# Patient Record
Sex: Male | Born: 1975 | Race: White | Hispanic: No | Smoking: Current some day smoker
Health system: Southern US, Community
[De-identification: ages and names within clinical notes are randomized; demographics above are authoritative.]

## PROBLEM LIST (undated history)

## (undated) DIAGNOSIS — G35D Multiple sclerosis, unspecified: Secondary | ICD-10-CM

## (undated) DIAGNOSIS — G35 Multiple sclerosis: Secondary | ICD-10-CM

## (undated) HISTORY — PX: CHOLECYSTECTOMY: SHX55

## (undated) HISTORY — PX: APPENDECTOMY: SHX54

## (undated) HISTORY — PX: OTHER SURGICAL HISTORY: SHX169

---

## 2004-08-13 ENCOUNTER — Emergency Department: Payer: Self-pay | Admitting: General Practice

## 2004-08-24 ENCOUNTER — Emergency Department: Payer: Self-pay | Admitting: Emergency Medicine

## 2005-07-31 ENCOUNTER — Emergency Department: Payer: Self-pay | Admitting: Emergency Medicine

## 2007-07-22 ENCOUNTER — Other Ambulatory Visit: Payer: Self-pay

## 2007-07-22 ENCOUNTER — Emergency Department: Payer: Self-pay | Admitting: Internal Medicine

## 2013-12-28 ENCOUNTER — Emergency Department: Payer: Self-pay | Admitting: Emergency Medicine

## 2013-12-28 LAB — COMPREHENSIVE METABOLIC PANEL
AST: 20 U/L (ref 15–37)
Albumin: 3.8 g/dL (ref 3.4–5.0)
Alkaline Phosphatase: 80 U/L
Anion Gap: 4 — ABNORMAL LOW (ref 7–16)
BUN: 16 mg/dL (ref 7–18)
Bilirubin,Total: 0.4 mg/dL (ref 0.2–1.0)
CHLORIDE: 109 mmol/L — AB (ref 98–107)
CO2: 29 mmol/L (ref 21–32)
CREATININE: 1.26 mg/dL (ref 0.60–1.30)
Calcium, Total: 8.8 mg/dL (ref 8.5–10.1)
EGFR (African American): >60
EGFR (Non-African Amer.): >60
Glucose: 86 mg/dL (ref 65–99)
OSMOLALITY: 284 (ref 275–301)
Potassium: 4.4 mmol/L (ref 3.5–5.1)
SGPT (ALT): 21 U/L
Sodium: 142 mmol/L (ref 136–145)
Total Protein: 7.8 g/dL (ref 6.4–8.2)

## 2013-12-28 LAB — URINALYSIS, COMPLETE
Bilirubin,UR: NEGATIVE
Glucose,UR: NEGATIVE mg/dL (ref 0–75)
Ketone: NEGATIVE
Leukocyte Esterase: NEGATIVE
Nitrite: NEGATIVE
PH: 5 (ref 4.5–8.0)
Protein: NEGATIVE
RBC,UR: 521 /HPF (ref 0–5)
SPECIFIC GRAVITY: 1.021 (ref 1.003–1.030)
Squamous Epithelial: <1

## 2013-12-28 LAB — LIPASE, BLOOD: Lipase: 192 U/L (ref 73–393)

## 2013-12-28 LAB — CBC
HCT: 50.7 % (ref 40.0–52.0)
HGB: 16.4 g/dL (ref 13.0–18.0)
MCH: 30.1 pg (ref 26.0–34.0)
MCHC: 32.4 g/dL (ref 32.0–36.0)
MCV: 93 fL (ref 80–100)
PLATELETS: 320 10*3/uL (ref 150–440)
RBC: 5.46 10*6/uL (ref 4.40–5.90)
RDW: 13.6 % (ref 11.5–14.5)
WBC: 11.3 10*3/uL — ABNORMAL HIGH (ref 3.8–10.6)

## 2014-01-17 ENCOUNTER — Emergency Department: Payer: Self-pay | Admitting: Emergency Medicine

## 2014-01-17 LAB — URINALYSIS, COMPLETE
Bilirubin,UR: NEGATIVE
Glucose,UR: NEGATIVE mg/dL (ref 0–75)
Ketone: NEGATIVE
LEUKOCYTE ESTERASE: NEGATIVE
Nitrite: NEGATIVE
Ph: 6 (ref 4.5–8.0)
Protein: NEGATIVE
Specific Gravity: 1.006 (ref 1.003–1.030)

## 2018-04-23 ENCOUNTER — Encounter: Payer: Self-pay | Admitting: Emergency Medicine

## 2018-04-23 ENCOUNTER — Inpatient Hospital Stay
Admission: EM | Admit: 2018-04-23 | Discharge: 2018-04-28 | DRG: 060 | Disposition: A | Discharge status: Home / Self Care | Payer: Medicare Other | Attending: Internal Medicine | Admitting: Internal Medicine

## 2018-04-23 ENCOUNTER — Other Ambulatory Visit: Payer: Self-pay

## 2018-04-23 ENCOUNTER — Inpatient Hospital Stay: Payer: Medicare Other

## 2018-04-23 DIAGNOSIS — F1210 Cannabis abuse, uncomplicated: Secondary | ICD-10-CM | POA: Diagnosis present

## 2018-04-23 DIAGNOSIS — Z880 Allergy status to penicillin: Secondary | ICD-10-CM | POA: Diagnosis not present

## 2018-04-23 DIAGNOSIS — T380X5A Adverse effect of glucocorticoids and synthetic analogues, initial encounter: Secondary | ICD-10-CM | POA: Diagnosis not present

## 2018-04-23 DIAGNOSIS — Z9049 Acquired absence of other specified parts of digestive tract: Secondary | ICD-10-CM | POA: Diagnosis not present

## 2018-04-23 DIAGNOSIS — F17200 Nicotine dependence, unspecified, uncomplicated: Secondary | ICD-10-CM | POA: Diagnosis not present

## 2018-04-23 DIAGNOSIS — R296 Repeated falls: Secondary | ICD-10-CM | POA: Diagnosis present

## 2018-04-23 DIAGNOSIS — G35 Multiple sclerosis: Secondary | ICD-10-CM | POA: Diagnosis present

## 2018-04-23 DIAGNOSIS — F329 Major depressive disorder, single episode, unspecified: Secondary | ICD-10-CM | POA: Diagnosis present

## 2018-04-23 DIAGNOSIS — D519 Vitamin B12 deficiency anemia, unspecified: Secondary | ICD-10-CM | POA: Diagnosis present

## 2018-04-23 DIAGNOSIS — F17210 Nicotine dependence, cigarettes, uncomplicated: Secondary | ICD-10-CM | POA: Diagnosis present

## 2018-04-23 DIAGNOSIS — K219 Gastro-esophageal reflux disease without esophagitis: Secondary | ICD-10-CM | POA: Diagnosis not present

## 2018-04-23 DIAGNOSIS — R259 Unspecified abnormal involuntary movements: Secondary | ICD-10-CM | POA: Diagnosis present

## 2018-04-23 HISTORY — DX: Multiple sclerosis, unspecified: G35.D

## 2018-04-23 HISTORY — DX: Multiple sclerosis: G35

## 2018-04-23 LAB — URINE DRUG SCREEN, QUALITATIVE (ARMC ONLY)
Amphetamines, Ur Screen: NOT DETECTED
Barbiturates, Ur Screen: NOT DETECTED
Benzodiazepine, Ur Scrn: NOT DETECTED
Cannabinoid 50 Ng, Ur ~~LOC~~: POSITIVE — AB
Cocaine Metabolite,Ur ~~LOC~~: NOT DETECTED
MDMA (Ecstasy)Ur Screen: NOT DETECTED
Methadone Scn, Ur: NOT DETECTED
Opiate, Ur Screen: NOT DETECTED
Phencyclidine (PCP) Ur S: NOT DETECTED
TRICYCLIC, UR SCREEN: NOT DETECTED

## 2018-04-23 LAB — URINALYSIS, COMPLETE (UACMP) WITH MICROSCOPIC
Bacteria, UA: NONE SEEN
Bilirubin Urine: NEGATIVE
Glucose, UA: NEGATIVE mg/dL
HGB URINE DIPSTICK: NEGATIVE
Ketones, ur: NEGATIVE mg/dL
Leukocytes,Ua: NEGATIVE
Nitrite: NEGATIVE
PROTEIN: NEGATIVE mg/dL
SPECIFIC GRAVITY, URINE: 1.01 (ref 1.005–1.030)
pH: 5 (ref 5.0–8.0)

## 2018-04-23 LAB — CBC WITH DIFFERENTIAL/PLATELET
Abs Immature Granulocytes: 0.03 10*3/uL (ref 0.00–0.07)
Basophils Absolute: 0.1 10*3/uL (ref 0.0–0.1)
Basophils Relative: 1 %
EOS ABS: 0.1 10*3/uL (ref 0.0–0.5)
Eosinophils Relative: 2 %
HCT: 48.8 % (ref 39.0–52.0)
Hemoglobin: 16.5 g/dL (ref 13.0–17.0)
Immature Granulocytes: 0 %
Lymphocytes Relative: 28 %
Lymphs Abs: 2.2 10*3/uL (ref 0.7–4.0)
MCH: 31.4 pg (ref 26.0–34.0)
MCHC: 33.8 g/dL (ref 30.0–36.0)
MCV: 92.8 fL (ref 80.0–100.0)
Monocytes Absolute: 1 10*3/uL (ref 0.1–1.0)
Monocytes Relative: 12 %
Neutro Abs: 4.4 10*3/uL (ref 1.7–7.7)
Neutrophils Relative %: 57 %
Platelets: 329 10*3/uL (ref 150–400)
RBC: 5.26 MIL/uL (ref 4.22–5.81)
RDW: 13.3 % (ref 11.5–15.5)
WBC: 7.7 10*3/uL (ref 4.0–10.5)
nRBC: 0 % (ref 0.0–0.2)

## 2018-04-23 LAB — TSH: TSH: 2.015 u[IU]/mL (ref 0.350–4.500)

## 2018-04-23 LAB — COMPREHENSIVE METABOLIC PANEL
ALT: 17 U/L (ref 0–44)
AST: 19 U/L (ref 15–41)
Albumin: 4.1 g/dL (ref 3.5–5.0)
Alkaline Phosphatase: 45 U/L (ref 38–126)
Anion gap: 6 (ref 5–15)
BUN: 13 mg/dL (ref 6–20)
CO2: 27 mmol/L (ref 22–32)
Calcium: 9.1 mg/dL (ref 8.9–10.3)
Chloride: 107 mmol/L (ref 98–111)
Creatinine, Ser: 0.86 mg/dL (ref 0.61–1.24)
GFR calc Af Amer: >60 mL/min (ref >60)
GFR calc non Af Amer: >60 mL/min (ref >60)
Glucose, Bld: 82 mg/dL (ref 70–99)
Potassium: 4.3 mmol/L (ref 3.5–5.1)
Sodium: 140 mmol/L (ref 135–145)
Total Bilirubin: 0.7 mg/dL (ref 0.3–1.2)
Total Protein: 7.3 g/dL (ref 6.5–8.1)

## 2018-04-23 LAB — SEDIMENTATION RATE: Sed Rate: 1 mm/hr (ref 0–15)

## 2018-04-23 LAB — FOLATE: Folate: 14.8 ng/mL (ref >5.9)

## 2018-04-23 LAB — VITAMIN B12: Vitamin B-12: 135 pg/mL — ABNORMAL LOW (ref 180–914)

## 2018-04-23 MED ORDER — GADOBUTROL 1 MMOL/ML IV SOLN
10.0000 mL | Freq: Once | INTRAVENOUS | Status: AC | PRN
  Administered 2018-04-23: 10 mL via INTRAVENOUS

## 2018-04-23 MED ORDER — ACETAMINOPHEN 325 MG PO TABS
650.0000 mg | ORAL_TABLET | Freq: Four times a day (QID) | ORAL | Status: DC | PRN
  Administered 2018-04-23 – 2018-04-27 (×5): 650 mg via ORAL
  Filled 2018-04-23 (×5): qty 2

## 2018-04-23 MED ORDER — ONDANSETRON HCL 4 MG PO TABS: 4.0000 mg | ORAL_TABLET | Freq: Four times a day (QID) | ORAL | Status: DC | PRN

## 2018-04-23 MED ORDER — ONDANSETRON HCL 4 MG/2ML IJ SOLN
4.0000 mg | Freq: Four times a day (QID) | INTRAMUSCULAR | Status: DC | PRN
  Administered 2018-04-26: 4 mg via INTRAVENOUS
  Filled 2018-04-23: qty 2

## 2018-04-23 MED ORDER — NICOTINE 21 MG/24HR TD PT24
21.0000 mg | MEDICATED_PATCH | Freq: Every day | TRANSDERMAL | Status: DC
  Administered 2018-04-23 – 2018-04-28 (×6): 21 mg via TRANSDERMAL
  Filled 2018-04-23 (×6): qty 1

## 2018-04-23 MED ORDER — BUPROPION HCL ER (XL) 150 MG PO TB24
150.0000 mg | ORAL_TABLET | ORAL | Status: DC
  Administered 2018-04-24 – 2018-04-28 (×5): 150 mg via ORAL
  Filled 2018-04-23 (×5): qty 1

## 2018-04-23 MED ORDER — ACETAMINOPHEN 650 MG RE SUPP: 650.0000 mg | Freq: Four times a day (QID) | RECTAL | Status: DC | PRN

## 2018-04-23 NOTE — ED Notes (Signed)
ED TO INPATIENT HANDOFF REPORT  ED Nurse Name and Phone #: [place] RN [phone]  S Name/Age/Gender Evan Reynolds 43 y.o. male Room/Bed: ED03A/ED03A  Code Status   Code Status: Not on file  Home/SNF/Other Home Patient oriented to: self, place, time and situation Is this baseline? Yes   Triage Complete: Triage complete  Chief Complaint fall  Triage Note Pt to ED via EMS from home with c/o increased falls, hx of MS. PT states numbness to LFT side increasing xfew days.pt in NAD, VSS. MD at bedside. PT has stool noted, states he fell on the way to bathroom    Allergies Allergies  Allergen Reactions  . Penicillins     Level of Care/Admitting Diagnosis ED Disposition    ED Disposition Condition Comment   Admit  Hospital Area: [facility] [[id number]]  Level of Care: Med-Surg [16]  Diagnosis: Multiple sclerosis (HCC) [340.ICD-9-CM]  Admitting Physician: Alford Highland [[id number]]  Attending Physician: Alford Highland [[id number]]  Estimated length of stay: past midnight tomorrow  Certification:: I certify this patient will need inpatient services for at least 2 midnights  PT Class (Do Not Modify): Inpatient [101]  PT Acc Code (Do Not Modify): Private [1]       B Medical/Surgery History Past Medical History:  Diagnosis Date  . MS (multiple sclerosis) (HCC)    Past Surgical History:  Procedure Laterality Date  . APPENDECTOMY    . arm surgery    . CHOLECYSTECTOMY       A IV Location/Drains/Wounds Patient Lines/Drains/Airways Status   Active Line/Drains/Airways    Name:   Placement date:   Placement time:   Site:   Days:   Peripheral IV 04/23/18 Right Antecubital   04/23/18    1242    Antecubital   less than 1          Intake/Output Last 24 hours No intake or output data in the 24 hours ending 04/23/18 1457  Labs/Imaging Results for orders placed or performed during the hospital encounter of 04/23/18 (from the past 48 hour(s))  Comprehensive  metabolic panel     Status: None   Collection Time: 04/23/18 12:40 PM  Result Value Ref Range   Sodium 140 135 - 145 mmol/L   Potassium 4.3 3.5 - 5.1 mmol/L   Chloride 107 98 - 111 mmol/L   CO2 27 22 - 32 mmol/L   Glucose, Bld 82 70 - 99 mg/dL   BUN 13 6 - 20 mg/dL   Creatinine, Ser 9.50 0.61 - 1.24 mg/dL   Calcium 9.1 8.9 - 72.2 mg/dL   Total Protein 7.3 6.5 - 8.1 g/dL   Albumin 4.1 3.5 - 5.0 g/dL   AST 19 15 - 41 U/L   ALT 17 0 - 44 U/L   Alkaline Phosphatase 45 38 - 126 U/L   Total Bilirubin 0.7 0.3 - 1.2 mg/dL   GFR calc non Af Amer >60 >60 mL/min   GFR calc Af Amer >60 >60 mL/min   Anion gap 6 5 - 15    Comment: Performed at [facility], [address] [place], [place] [address]  CBC with Differential     Status: None   Collection Time: 04/23/18 12:40 PM  Result Value Ref Range   WBC 7.7 4.0 - 10.5 K/uL   RBC 5.26 4.22 - 5.81 MIL/uL   Hemoglobin 16.5 13.0 - 17.0 g/dL   HCT 18.3 35.8 - 25.1 %   MCV 92.8 80.0 - 100.0 fL  MCH 31.4 26.0 - 34.0 pg   MCHC 33.8 30.0 - 36.0 g/dL   RDW 03.5 46.5 - 68.1 %   Platelets 329 150 - 400 K/uL   nRBC 0.0 0.0 - 0.2 %   Neutrophils Relative % 57 %   Neutro Abs 4.4 1.7 - 7.7 K/uL   Lymphocytes Relative 28 %   Lymphs Abs 2.2 0.7 - 4.0 K/uL   Monocytes Relative 12 %   Monocytes Absolute 1.0 0.1 - 1.0 K/uL   Eosinophils Relative 2 %   Eosinophils Absolute 0.1 0.0 - 0.5 K/uL   Basophils Relative 1 %   Basophils Absolute 0.1 0.0 - 0.1 K/uL   Immature Granulocytes 0 %   Abs Immature Granulocytes 0.03 0.00 - 0.07 K/uL    Comment: Performed at [facility], [address] [place], [place] [address]  Urinalysis, Complete w Microscopic     Status: Abnormal   Collection Time: 04/23/18 12:40 PM  Result Value Ref Range   Color, Urine YELLOW (A) YELLOW   APPearance CLEAR (A) CLEAR   Specific Gravity, Urine 1.010 1.005 - 1.030   pH 5.0 5.0 - 8.0   Glucose, UA NEGATIVE NEGATIVE mg/dL   Hgb urine dipstick NEGATIVE  NEGATIVE   Bilirubin Urine NEGATIVE NEGATIVE   Ketones, ur NEGATIVE NEGATIVE mg/dL   Protein, ur NEGATIVE NEGATIVE mg/dL   Nitrite NEGATIVE NEGATIVE   Leukocytes,Ua NEGATIVE NEGATIVE   RBC / HPF 0-5 0 - 5 RBC/hpf   WBC, UA 0-5 0 - 5 WBC/hpf   Bacteria, UA NONE SEEN NONE SEEN   Squamous Epithelial / LPF 0-5 0 - 5   Mucus PRESENT     Comment: Performed at [facility], [address] [place], [place] [address]   No results found.  Pending Labs Unresulted Labs (From admission, onward)    Start     Ordered   04/23/18 1436  B. burgdorfi antibodies  Once,   STAT     04/23/18 1435   04/23/18 1436  HIV Antibody (routine testing w rflx)  Once,   STAT     04/23/18 1435   04/23/18 1436  TSH  Once,   STAT     04/23/18 1435   04/23/18 1436  RPR  Once,   STAT     04/23/18 1435   04/23/18 1435  Vitamin B12  Once,   STAT     04/23/18 1435   04/23/18 1435  Folate  Once,   STAT     04/23/18 1435   04/23/18 1435  ANA  Once,   STAT     04/23/18 1435   04/23/18 1435  Sedimentation rate  Once,   STAT     04/23/18 1435   04/23/18 1435  Rheumatoid factor  Once,   STAT     04/23/18 1435          Vitals/Pain Today's Vitals   04/23/18 1231 04/23/18 1233  BP: (!) 111/95   Pulse:  71  Resp: 16   Temp: 98.3 F (36.8 C)   TempSrc: Oral   SpO2: 98%   PainSc: 9      Isolation Precautions No active isolations  Medications Medications  buPROPion (WELLBUTRIN XL) 24 hr tablet 150 mg (has no administration in time range)    Mobility walks with person assist High fall risk   Focused Assessments  R Recommendations: See Admitting Provider Note  Report given to:   Additional Notes:

## 2018-04-23 NOTE — ED Provider Notes (Signed)
[facility] Emergency Department Provider Note   ____________________________________________   First MD Initiated Contact with Patient 04/23/18 1438     (approximate)  I have reviewed the triage vital signs and the nursing notes.   HISTORY  Chief Complaint Fall   HPI Evan Reynolds. is a 43 y.o. male who reports he has multiple sclerosis and for the last 3 days he has been getting progressive left-sided weakness and some numbness in his left leg.  He is fallen several times but now it is to the point where his leg is so weak he cannot walk any longer.  Extensive search of old records is not turned up a diagnosis of multiple sclerosis for him but he knows a lot about the subject and appears to be legitimate.  Neurology is seeing him recommends MRI of the head neck and spine.  We will get him in the hospital.         Past Medical History:  Diagnosis Date  . MS (multiple sclerosis) [facility])     Patient Active Problem List   Diagnosis Date Noted  . Multiple sclerosis (HCC) 04/23/2018    Past Surgical History:  Procedure Laterality Date  . APPENDECTOMY    . arm surgery    . CHOLECYSTECTOMY      Prior to Admission medications   Medication Sig Start Date End Date Taking? Authorizing Provider  buPROPion (WELLBUTRIN XL) 150 MG 24 hr tablet Take 150 mg by mouth every morning.    [provider]    Allergies Penicillins  Family History  Problem Relation Age of Onset  . Thyroid disease Mother   . Diabetes Father   . CAD Father     Social History Social History   Tobacco Use  . Smoking status: Current Some Day Smoker    Years: 1.00  . Smokeless tobacco: Never Used  Substance Use Topics  . Alcohol use: Not Currently  . Drug use: Yes    Types: Marijuana    Review of Systems  Constitutional: No fever/chills Eyes: No visual changes. ENT: No sore throat. Cardiovascular: Denies chest pain. Respiratory: Denies shortness of  breath. Gastrointestinal: No abdominal pain.  No nausea, no vomiting.  No diarrhea.  No constipation. Genitourinary: Negative for dysuria. Musculoskeletal: Negative for back pain. Skin: Negative for rash. Neurological: Negative for headaches   ____________________________________________   PHYSICAL EXAM:  VITAL SIGNS: ED Triage Vitals  Enc Vitals Group     BP 04/23/18 1231 (!) 111/95     Pulse Rate 04/23/18 1233 71     Resp 04/23/18 1231 16     Temp 04/23/18 1231 98.3 F (36.8 C)     Temp Source 04/23/18 1231 Oral     SpO2 04/23/18 1231 98 %     Weight --      Height --      Head Circumference --      Peak Flow --      Pain Score 04/23/18 1231 9     Pain Loc --      Pain Edu? --      Excl. in GC? --     Constitutional: Alert and oriented. Well appearing and in no acute distress. Eyes: Conjunctivae are normal. PERRL. EOMI. Head: Atraumatic. Nose: No congestion/rhinnorhea. Mouth/Throat: Mucous membranes are moist.  Oropharynx non-erythematous. Neck: No stridor.  Cardiovascular: Normal rate, regular rhythm. Grossly normal heart sounds.  Good peripheral circulation. Respiratory: Normal respiratory effort.  No retractions. Lungs CTAB. Gastrointestinal:  Soft and nontender. No distention. No abdominal bruits. No CVA tenderness. Musculoskeletal: No lower extremity tenderness nor edema.   Neurologic:  Normal speech and language.  He left arm is noticeably weaker than the right.  Left leg is very weak.  He cannot move it against gravity.  He has some fresh bruising on the knee but no bony tenderness and no obvious swelling.  Distal pulses are good. Skin:  Skin is warm, dry and intact. No rash noted. Psychiatric: Mood and affect are normal. Speech and behavior are normal.  ____________________________________________   LABS (all labs ordered are listed, but only abnormal results are displayed)  Labs Reviewed  URINALYSIS, COMPLETE (UACMP) WITH MICROSCOPIC - Abnormal; Notable  for the following components:      Result Value   Color, Urine YELLOW (*)    APPearance CLEAR (*)    All other components within normal limits  COMPREHENSIVE METABOLIC PANEL  CBC WITH DIFFERENTIAL/PLATELET  VITAMIN B12  FOLATE  ANA  SEDIMENTATION RATE  RHEUMATOID FACTOR  B. BURGDORFI ANTIBODIES  HIV ANTIBODY (ROUTINE TESTING W REFLEX)  TSH  RPR  CSF CELL COUNT WITH DIFFERENTIAL  PROTEIN AND GLUCOSE, CSF  VDRL, CSF  OLIGOCLONAL BANDS, CSF + SERM  IGG CSF INDEX  URINE DRUG SCREEN, QUALITATIVE ([facility] ONLY)   ____________________________________________  EKG   ____________________________________________  RADIOLOGY  ED MD interpretation:    Official radiology report(s): No results found.  ____________________________________________   PROCEDURES  Procedure(s) performed (including Critical Care):  Procedures   ____________________________________________   INITIAL IMPRESSION / ASSESSMENT AND PLAN / ED COURSE  Discussed patient with neurology Dr. Loretha Brasil and the NP.  They agree that he needs to come in the hospital that will order the MRIs.  Dr. Fonnie Birkenhead has seen him and will get him in the hospital.              ____________________________________________   FINAL CLINICAL IMPRESSION(S) / ED DIAGNOSES  Final diagnoses:  Multiple sclerosis [facility])     ED Discharge Orders    None       Note:  This document was prepared using Dragon voice recognition software and may include unintentional dictation errors.    Arnaldo Natal, MD 04/23/18 [phone]

## 2018-04-23 NOTE — ED Triage Notes (Signed)
Pt to ED via EMS from home with c/o increased falls, hx of MS. PT states numbness to LFT side increasing xfew days.pt in NAD, VSS. MD at bedside. PT has stool noted, states he fell on the way to bathroom

## 2018-04-23 NOTE — Consult Note (Addendum)
Reason for Consult: History of multiple sclerosis Referring Physician: Conni Slipper, F MD  CC: Upper and lower extremity weakness  HPI: Evan Reynolds. is an 43 y.o. male with past medical history of tobacco use, relapsing remitting multiple sclerosis currently not on disease modifying drugs presenting to the ED on 04/23/2018 with chief complaints of worsening upper and lower extremity weakness resulting in a fall.  Patient report that he was diagnosed with MS over 20 years ago with MRI of the brain.  He apparently had not had lumbar puncture done.  He was on Rebif trial at [facility] but prior the [facility] specialist who was following him he did not seem to be benefiting from the medication therefore it was stopped.  Patient is not a good historian therefore he is unable to give reliable history at this time.  There are no history or imaging available on care everywhere for validation of diagnosis.  Patient reports that he fell 2 days ago and again today while trying to get out of the bed he noticed that he could not move his legs, when he tried to get up from the bed to use the bathroom he fell on the floor and was unable to get up.  Patient reports that he lost control of his bladder without loss of consciousness.  EMS was called by patient's girlfriend and on arrival he was found to have urine and feces on himself.  Patient reports that he was hard pressed to use the bathroom however he could not walk therefore had to urinate on himself.  He is complaining more of worsening symptoms on the left side> right side. Denies associated altered sensorium, speech abnormality, cranial nerve deficit, seizures, diplopia, nausea or vomiting, syncope or LOC, dizziness, headache or vision disturbances.  Patient states that his symptoms seems to have been progressively getting worse over the last few years since diagnosis.  He reports that his been evaluated by a neurologist at [facility], no records were available at this  time.On arrival to the ED, he was afebrile with blood pressure 111/74 mm Hg and pulse rate 71 beats/min. There were no focal neurological deficits; he was alert and oriented x4, and he did not demonstrate any memory deficits. Complete blood count (CBC), comprehensive metabolic panel (CMP), and urinalysis were unremarkable. Patient will be admitted for further work up and management.  Past Medical History:  Diagnosis Date  . MS (multiple sclerosis) ([place])     Past Surgical History:  Procedure Laterality Date  . APPENDECTOMY    . CHOLECYSTECTOMY      No family history on file.  Social History:  reports that he has been smoking. He has never used smokeless tobacco. He reports previous alcohol use. He reports current drug use. Drug: Marijuana.  Allergies  Allergen Reactions  . Penicillins     Medications: I have reviewed the patient's current medications. Prior to Admission: (Not in a hospital admission)  Scheduled: Prior to Admission medications   Medication Sig Start Date End Date Taking? Authorizing Provider  buPROPion (WELLBUTRIN XL) 150 MG 24 hr tablet Take 150 mg by mouth every morning.    [provider]   ROS: History obtained from the patient   General ROS: negative for - chills, fatigue, fever, night sweats, weight gain or weight loss Psychological ROS: negative for - behavioral disorder, hallucinations, memory difficulties, mood swings or suicidal ideation Ophthalmic ROS: negative for - blurry vision, double vision, eye pain or loss of vision ENT ROS:  negative for - epistaxis, nasal discharge, oral lesions, sore throat, tinnitus or vertigo Allergy and Immunology ROS: negative for - hives or itchy/watery eyes Hematological and Lymphatic ROS: negative for - bleeding problems, bruising or swollen lymph nodes Endocrine ROS: negative for - galactorrhea, hair pattern changes, polydipsia/polyuria or temperature intolerance Respiratory ROS: negative for - cough,  hemoptysis, shortness of breath or wheezing Cardiovascular ROS: negative for - chest pain, dyspnea on exertion, edema or irregular heartbeat Gastrointestinal ROS: negative for - abdominal pain, diarrhea, hematemesis, nausea/vomiting or stool incontinence Genito-Urinary ROS: negative for - dysuria, hematuria, incontinence or urinary frequency/urgency Musculoskeletal ROS: negative for - joint swelling or muscular weakness Neurological ROS: as noted in HPI Dermatological ROS: negative for rash and skin lesion changes  Physical Examination: Blood pressure (!) 111/95, pulse 71, temperature 98.3 F (36.8 C), temperature source Oral, resp. rate 16, SpO2 98 %.  HEENT-  Normocephalic, no lesions, without obvious abnormality.  Normal external eye and conjunctiva.  Normal TM's bilaterally.  Normal auditory canals and external ears. Normal external nose, mucus membranes and septum.  Normal pharynx. Cardiovascular- S1, S2 normal, pulses palpable throughout   Lungs- chest clear, no wheezing, rales, normal symmetric air entry Abdomen- soft, non-tender; bowel sounds normal; no masses,  no organomegaly Extremities- no edema Lymph-no adenopathy palpable Musculoskeletal-no joint tenderness, deformity or swelling Skin-warm and dry, no hyperpigmentation, vitiligo, or suspicious lesions  Neurological Exam   Mental Status: Alert, oriented, thought content appropriate.  Speech fluent without evidence of aphasia.  Able to follow 3 step commands without difficulty. Attention span and concentration seemed appropriate  Cranial Nerves: II: Discs flat bilaterally; Visual fields grossly normal, pupils equal, round, reactive to light and accommodation III,IV, VI: ptosis not present, extra-ocular motions intact bilaterally V,VII: smile symmetric, facial light touch sensation intact VIII: hearing normal bilaterally IX,X: gag reflex present XI: bilateral shoulder shrug XII: midline tongue extension Motor: Right :   Upper extremity   5/5 Without pronator drift      Left: Upper extremity   4/5 without pronator drift Right:   Lower extremity   4/5                                          Left: Lower extremity   3/5 Large amplitude tremors noted on the right upper extremity Tone and bulk:normal tone throughout; no atrophy noted Sensory: Pinprick and light touch decreased on the left Deep Tendon Reflexes: 3+ (hypereflexia) and symmetric throughout Plantars: Right: mute                              Left: mute Cerebellar: Finger-to-nose testing dysmetric on the left. Unable to perform heel to shin testing on the LLE Gait: not tested due to safety concerns  Data Reviewed  Laboratory Studies:   Basic Metabolic Panel: Recent Labs  Lab 04/23/18 1240  NA 140  K 4.3  CL 107  CO2 27  GLUCOSE 82  BUN 13  CREATININE 0.86  CALCIUM 9.1    Liver Function Tests: Recent Labs  Lab 04/23/18 1240  AST 19  ALT 17  ALKPHOS 45  BILITOT 0.7  PROT 7.3  ALBUMIN 4.1   No results for input(s): LIPASE, AMYLASE in the last 168 hours. No results for input(s): AMMONIA in the last 168 hours.  CBC: Recent Labs  Lab 04/23/18 1240  WBC 7.7  NEUTROABS 4.4  HGB 16.5  HCT 48.8  MCV 92.8  PLT 329    Cardiac Enzymes: No results for input(s): CKTOTAL, CKMB, CKMBINDEX, TROPONINI in the last 168 hours.  BNP: Invalid input(s): POCBNP  CBG: No results for input(s): GLUCAP in the last 168 hours.  Microbiology: No results found for this or any previous visit.  Coagulation Studies: No results for input(s): LABPROT, INR in the last 72 hours.  Urinalysis:  Recent Labs  Lab 04/23/18 1240  COLORURINE YELLOW*  LABSPEC 1.010  PHURINE 5.0  GLUCOSEU NEGATIVE  HGBUR NEGATIVE  BILIRUBINUR NEGATIVE  KETONESUR NEGATIVE  PROTEINUR NEGATIVE  NITRITE NEGATIVE  LEUKOCYTESUR NEGATIVE    Lipid Panel:  No results found for: CHOL, TRIG, HDL, CHOLHDL, VLDL, LDLCALC  HgbA1C: No results found for: HGBA1C  Urine  Drug Screen:  No results found for: LABOPIA, COCAINSCRNUR, LABBENZ, AMPHETMU, THCU, LABBARB  Alcohol Level: No results for input(s): ETH in the last 168 hours.  Other results: EKG: there are no previous tracings available for comparison.  Imaging: No results found.  Assessment:with past medical history of tobacco use, relapsing remitting multiple sclerosis currently not on disease modifying drugs presenting to the ED on 04/23/2018 with chief complaints of worsening upper and lower extremity weakness resulting in a fall.  Given past medical history of MS clinical presentation concerning for worsening demyelinating disease.  Patient was not on any disease modifying drugs.  Further work-up recommended.  Plan: 1. MRI brain with and without contrast/ cervical spine with contrast/MRI lumbar spine with contrast 2. Check labs: B12, folate, ANA, ESR, RF, Lyme disease, HIV, TSH, RPR 3. IR to perform LP for CSF analysis: CSF oligoclonal bands, IgG index, cell count, protein, glucose 4. Consider steroids pending imaging above 5. PT/OT to evaluate  This patient was staffed with Dr. Irish Elders, Alease Frame who personally evaluated patient, reviewed documentation and agreed with assessment and plan of care as above.  Rufina Falco, DNP, FNP-BC Board certified Nurse Practitioner Neurology Department    04/23/2018, 2:10 PM

## 2018-04-23 NOTE — H&P (Signed)
[facility] [place] [facility]   PATIENT NAME: Evan Reynolds    MR#:  [id number]  DATE OF BIRTH:  [birth year]  DATE OF ADMISSION:  04/23/2018  PRIMARY CARE PHYSICIAN: Patient, No Pcp Per   REQUESTING/REFERRING PHYSICIAN: Dr Dorothea Glassman  CHIEF COMPLAINT:   Chief Complaint  Patient presents with  . Fall    HISTORY OF PRESENT ILLNESS:  Evan Reynolds  is a 43 y.o. male with a known history of MS presents after a fall.  He states that he has had left-sided weakness.  Has had some tingling in the left arm also.  This is been going on for 3 days.  His left foot could not move today.  He ended up using the bathroom on himself.  He thinks that his MS is acting up.  He smokes marijuana to control his Anas.  He does not have a doctor or has not seen a neurologist in a while.  ER physician consulted neurology who recommended MRIs and possible LP.  Hospitalist services were contacted for further evaluation.  PAST MEDICAL HISTORY:   Past Medical History:  Diagnosis Date  . MS (multiple sclerosis) (HCC)     PAST SURGICAL HISTORY:   Past Surgical History:  Procedure Laterality Date  . APPENDECTOMY    . arm surgery    . CHOLECYSTECTOMY      SOCIAL HISTORY:   Social History   Tobacco Use  . Smoking status: Current Some Day Smoker    Years: 1.00  . Smokeless tobacco: Never Used  Substance Use Topics  . Alcohol use: Not Currently    FAMILY HISTORY:   Family History  Problem Relation Age of Onset  . Thyroid disease Mother   . Diabetes Father   . CAD Father     DRUG ALLERGIES:   Allergies  Allergen Reactions  . Penicillins     REVIEW OF SYSTEMS:  CONSTITUTIONAL: No fever.positive for chills and sweats.  Positive for left leg and left arm weakness. EYES: No blurred or double vision.  EARS, NOSE, AND THROAT: No tinnitus or ear pain. No sore throat RESPIRATORY: No cough, shortness of breath, wheezing or hemoptysis.  CARDIOVASCULAR: No chest  pain, orthopnea, edema.  GASTROINTESTINAL: No nausea, vomiting, diarrhea or abdominal pain. No blood in bowel movements GENITOURINARY: No dysuria, hematuria.  ENDOCRINE: No polyuria, nocturia,  HEMATOLOGY: No anemia, easy bruising or bleeding SKIN: No rash or lesion. MUSCULOSKELETAL: Left shoulder pain and body aches NEUROLOGIC: No tingling, numbness, weakness.  PSYCHIATRY: No anxiety or depression.   MEDICATIONS AT HOME:   Prior to Admission medications   Medication Sig Start Date End Date Taking? Authorizing Provider  buPROPion (WELLBUTRIN XL) 150 MG 24 hr tablet Take 150 mg by mouth every morning.    [provider]      VITAL SIGNS:  Blood pressure (!) 111/95, pulse 71, temperature 98.3 F (36.8 C), temperature source Oral, resp. rate 16, SpO2 98 %.  PHYSICAL EXAMINATION:  GENERAL:  43 y.o.-year-old patient lying in the bed with no acute distress.  EYES: Pupils equal, round, reactive to light and accommodation. No scleral icterus. Extraocular muscles intact.  HEENT: Head atraumatic, normocephalic. Oropharynx and nasopharynx clear.  NECK:  Supple, no jugular venous distention. No thyroid enlargement, no tenderness.  LUNGS: Normal breath sounds bilaterally, no wheezing, rales,rhonchi or crepitation. No use of accessory muscles of respiration.  CARDIOVASCULAR: S1, S2 normal. No murmurs, rubs, or gallops.  ABDOMEN: Soft, nontender, nondistended. Bowel sounds present. No organomegaly  or mass.  EXTREMITIES: No pedal edema, cyanosis, or clubbing.  NEUROLOGIC: Cranial nerves II through XII are intact.  Power 5 out of 5 right side.  Slight incoordination with finger to finger rapid movements left hand.  Weakness left arm.  Weakness left leg.  Able to straight leg raise but unable to flex and extend at the left ankle. PSYCHIATRIC: The patient is alert and oriented x 3.  SKIN: No rash, lesion, or ulcer.   LABORATORY PANEL:   CBC Recent Labs  Lab 04/23/18 1240  WBC 7.7  HGB  16.5  HCT 48.8  PLT 329   ------------------------------------------------------------------------------------------------------------------  Chemistries  Recent Labs  Lab 04/23/18 1240  NA 140  K 4.3  CL 107  CO2 27  GLUCOSE 82  BUN 13  CREATININE 0.86  CALCIUM 9.1  AST 19  ALT 17  ALKPHOS 45  BILITOT 0.7   ------------------------------------------------------------------------------------------------------------------    EKG:   Ordered by me  IMPRESSION AND PLAN:   1.  Left-sided weakness.  Could be MS exacerbation.  MRI is ordered.  Seen by neurology.  Based on imaging studies may decide on steroids.  Also MRI will also see if this is a stroke or not.  We will get physical therapy evaluation.  May end up needing a lumbar puncture based on imaging studies. 2.  Tobacco abuse smoking cessation counseling done 4 minutes by me.  Nicotine patch ordered. 3.  Marijuana abuse.  Advised to stop smoking marijuana  All the records are reviewed and case discussed with ED provider. Management plans discussed with the patient, family and they are in agreement.  CODE STATUS: Full code  TOTAL TIME TAKING CARE OF THIS PATIENT: 45 minutes.    Alford Highland M.D on 04/23/2018 at 2:57 PM  Between 7am to 6pm - Pager - [phone]  After 6pm call admission pager [phone]  [facility]  [phone]  CC: Primary care physician; Patient, No Pcp Per

## 2018-04-24 ENCOUNTER — Inpatient Hospital Stay: Payer: Medicare Other

## 2018-04-24 LAB — PATHOLOGIST SMEAR REVIEW

## 2018-04-24 LAB — CBC
HCT: 48.8 % (ref 39.0–52.0)
Hemoglobin: 16.3 g/dL (ref 13.0–17.0)
MCH: 31.2 pg (ref 26.0–34.0)
MCHC: 33.4 g/dL (ref 30.0–36.0)
MCV: 93.5 fL (ref 80.0–100.0)
Platelets: 327 10*3/uL (ref 150–400)
RBC: 5.22 MIL/uL (ref 4.22–5.81)
RDW: 13.3 % (ref 11.5–15.5)
WBC: 11.1 10*3/uL — ABNORMAL HIGH (ref 4.0–10.5)
nRBC: 0 % (ref 0.0–0.2)

## 2018-04-24 LAB — CSF CELL COUNT WITH DIFFERENTIAL
EOS CSF: 0 %
Lymphs, CSF: 94 %
Monocyte-Macrophage-Spinal Fluid: 6 %
Other Cells, CSF: 0
RBC Count, CSF: 0 /mm3 (ref 0–3)
Segmented Neutrophils-CSF: 0 %
Tube #: 3
WBC, CSF: 11 /mm3 (ref 0–5)

## 2018-04-24 LAB — B. BURGDORFI ANTIBODIES: B burgdorferi Ab IgG+IgM: <0.91 {ISR} (ref 0.00–0.90)

## 2018-04-24 LAB — BASIC METABOLIC PANEL
Anion gap: 9 (ref 5–15)
BUN: 15 mg/dL (ref 6–20)
CO2: 23 mmol/L (ref 22–32)
Calcium: 8.9 mg/dL (ref 8.9–10.3)
Chloride: 111 mmol/L (ref 98–111)
Creatinine, Ser: 0.89 mg/dL (ref 0.61–1.24)
GFR calc Af Amer: >60 mL/min (ref >60)
GFR calc non Af Amer: >60 mL/min (ref >60)
Glucose, Bld: 98 mg/dL (ref 70–99)
Potassium: 3.9 mmol/L (ref 3.5–5.1)
Sodium: 143 mmol/L (ref 135–145)

## 2018-04-24 LAB — RPR: RPR Ser Ql: NONREACTIVE

## 2018-04-24 LAB — PROTEIN AND GLUCOSE, CSF
Glucose, CSF: 59 mg/dL (ref 40–70)
Total  Protein, CSF: 22 mg/dL (ref 15–45)

## 2018-04-24 LAB — RHEUMATOID FACTOR: RHEUMATOID FACTOR: 14.2 [IU]/mL — AB (ref 0.0–13.9)

## 2018-04-24 LAB — HIV ANTIBODY (ROUTINE TESTING W REFLEX): HIV Screen 4th Generation wRfx: NONREACTIVE

## 2018-04-24 LAB — ANA: Anti Nuclear Antibody (ANA): NEGATIVE

## 2018-04-24 MED ORDER — SODIUM CHLORIDE 0.9 % IV SOLN
1000.0000 mg | Freq: Every day | INTRAVENOUS | Status: AC
  Administered 2018-04-24 – 2018-04-28 (×5): 1000 mg via INTRAVENOUS
  Filled 2018-04-24 (×5): qty 8

## 2018-04-24 MED ORDER — CYANOCOBALAMIN 1000 MCG/ML IJ SOLN
1000.0000 ug | Freq: Once | INTRAMUSCULAR | Status: DC
  Filled 2018-04-24: qty 1

## 2018-04-24 MED ORDER — VITAMIN B-12 1000 MCG PO TABS
1000.0000 ug | ORAL_TABLET | Freq: Every day | ORAL | Status: DC
  Administered 2018-04-24 – 2018-04-28 (×5): 1000 ug via ORAL
  Filled 2018-04-24 (×5): qty 1

## 2018-04-24 MED ORDER — METHOCARBAMOL 500 MG PO TABS
750.0000 mg | ORAL_TABLET | Freq: Three times a day (TID) | ORAL | Status: DC | PRN
  Administered 2018-04-24 – 2018-04-28 (×6): 750 mg via ORAL
  Filled 2018-04-24 (×6): qty 2

## 2018-04-24 MED ORDER — VITAMIN D 25 MCG (1000 UNIT) PO TABS
1000.0000 [IU] | ORAL_TABLET | Freq: Every day | ORAL | Status: DC
  Administered 2018-04-24 – 2018-04-27 (×4): 1000 [IU] via ORAL
  Filled 2018-04-24 (×4): qty 1

## 2018-04-24 NOTE — Progress Notes (Addendum)
Please see initial HPI dated 04/23/2018 for brief history of presenting illness.  Subjective: Patient denies worsening symptoms still with upper and lower extremity weakness left greater than right.  He was able to get up with PT today with minimal assistance however,  he was noted to be dragging his left foot.  No new concerns today.  Objective: Current vital signs: BP 127/81 (BP Location: Right Arm)   Pulse 64   Temp 97.9 F (36.6 C) (Oral)   Resp 17   Ht '6\' 1"'  (1.854 m)   Wt 111.1 kg   SpO2 99%   BMI 32.32 kg/m  Vital signs in last 24 hours: Temp:  [97.7 F (36.5 C)-98.5 F (36.9 C)] 97.9 F (36.6 C) (03/20 0735) Pulse Rate:  [60-71] 64 (03/20 0735) Resp:  [16-20] 17 (03/20 0735) BP: (111-127)/(81-95) 127/81 (03/20 0735) SpO2:  [97 %-99 %] 99 % (03/20 0735) Weight:  [111.1 kg] 111.1 kg (03/19 1609)  Intake/Output from previous day: 03/19 0701 - 03/20 0700 In: 360 [P.O.:360] Out: 150 [Urine:150] Intake/Output this shift: No intake/output data recorded. Nutritional status:  Diet Order            Diet regular Room service appropriate? Yes; Fluid consistency: Thin  Diet effective now             Neurological Exam  Mental Status: Alert, oriented, thought content appropriate. Speech fluent without evidence of aphasia. Able to follow 3 step commands without difficulty. Attention span and concentration seemed appropriate  Cranial Nerves: II: Discs flat bilaterally; Visual fields grossly normal, pupils equal, round, reactive to light and accommodation III,IV, VI: ptosis not present, extra-ocular motions intact bilaterally V,VII: smile symmetric, facial light touch sensationintact VIII: hearing normal bilaterally IX,X: gag reflex present XI: bilateral shoulder shrug XII: midline tongue extension Motor: Right :Upper extremity 5/5Without pronator driftLeft: Upper extremity 4/5 without pronator drift Right:Lower extremity  4/5Left: Lower extremity 4-/5 Large amplitude tremors noted on the right upper extremity Left foot droop Tone and bulk:normal tone throughout; no atrophy noted Sensory: Pinprick and light touchdecreased on the left Deep Tendon Reflexes: 3+ (hypereflexia) and symmetric throughout Plantars: Right:muteLeft: mute Cerebellar: Finger-to-nosetesting dysmetric on the left. Unable to perform heel to shin testing on the LLE Gait: not tested due to safety concerns  Data Reviewed  Lab Results: Basic Metabolic Panel: Recent Labs  Lab 04/23/18 1240 04/24/18 0451  NA 140 143  K 4.3 3.9  CL 107 111  CO2 27 23  GLUCOSE 82 98  BUN 13 15  CREATININE 0.86 0.89  CALCIUM 9.1 8.9    Liver Function Tests: Recent Labs  Lab 04/23/18 1240  AST 19  ALT 17  ALKPHOS 45  BILITOT 0.7  PROT 7.3  ALBUMIN 4.1   No results for input(s): LIPASE, AMYLASE in the last 168 hours. No results for input(s): AMMONIA in the last 168 hours.  CBC: Recent Labs  Lab 04/23/18 1240 04/24/18 0451  WBC 7.7 11.1*  NEUTROABS 4.4  --   HGB 16.5 16.3  HCT 48.8 48.8  MCV 92.8 93.5  PLT 329 327    Cardiac Enzymes: No results for input(s): CKTOTAL, CKMB, CKMBINDEX, TROPONINI in the last 168 hours.  Lipid Panel: No results for input(s): CHOL, TRIG, HDL, CHOLHDL, VLDL, LDLCALC in the last 168 hours.  CBG: No results for input(s): GLUCAP in the last 168 hours.  Microbiology: No results found for this or any previous visit.  Coagulation Studies: No results for input(s): LABPROT, INR in the last 72  hours.  Imaging: Mr Jeri Cos Wo Contrast  Result Date: 04/24/2018 CLINICAL DATA:  Initial evaluation for acute left-sided weakness, history of multiple sclerosis. EXAM: MRI HEAD WITHOUT AND WITH CONTRAST MRI CERVICAL SPINE WITHOUT AND WITH CONTRAST MRI LUMBAR SPINE WITHOUT AND WITH CONTRAST TECHNIQUE: Multiplanar, multiecho pulse  sequences of the brain and surrounding structures, and cervical spine, to include the craniocervical junction and cervicothoracic junction, as well as the lumbar spine were obtained without and with intravenous contrast. CONTRAST:  10 cc of Gadavist. COMPARISON:  None available. FINDINGS: MRI HEAD FINDINGS Brain: Diffuse prominence of the CSF containing spaces compatible with generalized cerebral atrophy, advanced for age. Asymmetric atrophy noted involving the left midbrain/cerebral peduncle. Multifocal patchy T2/FLAIR hyperintense lesions seen involving the periventricular, deep, and subcortical white matter both cerebral hemispheres, advanced for age. Overall distribution compatible with known history of multiple sclerosis. Few patchy infratentorial lesions noted as well. No associated restricted diffusion or enhancement to suggest active demyelination. No evidence for acute or subacute infarct. Few small chronic left cerebellar infarcts noted. No evidence for acute or chronic intracranial hemorrhage. No mass lesion, midline shift or mass effect. No hydrocephalus. No extra-axial fluid collection. No other abnormal enhancement. Pituitary gland within normal limits. Midline structures intact. Vascular: Major intracranial vascular flow voids maintained. Skull and upper cervical spine: Craniocervical junction within normal limits. Bone marrow signal intensity normal. Scalp soft tissues within normal limits. Sinuses/Orbits: Globes and orbital soft tissues within normal limits. Paranasal sinuses are clear. No mastoid effusion. Inner ear structures normal. Other: None. MRI CERVICAL SPINE FINDINGS Alignment: Vertebral bodies normally aligned with preservation of the normal cervical lordosis. No listhesis. Vertebrae: Vertebral body heights maintained without evidence for acute or chronic fracture. Bone marrow signal intensity within normal limits. No discrete or worrisome osseous lesions. Minimal reactive endplate changes  present about the C5-6 interspace. No other abnormal marrow edema. Cord: Abnormal T2 signal abnormality seen within the central and left aspect of the cord at the level of C4-5 (series 9, image 16, 13). Lesion measures approximately 16 mm in craniocaudad dimension (series 5, image 9). Associated patchy post-contrast enhancement with cord expansion, compatible with active demyelination (series 12, image 10). No other definite cord signal abnormality identified. No other abnormal enhancement. Posterior Fossa, vertebral arteries, paraspinal tissues: Craniocervical junction normal. Paraspinous and prevertebral soft tissues within normal limits. Normal intravascular flow voids seen within the vertebral arteries bilaterally. Disc levels: C2-C3: Unremarkable. C3-C4:  Mild disc bulge.  No canal or foraminal stenosis. C4-C5: Minimal uncovertebral hypertrophy. No significant canal or foraminal stenosis. C5-C6: Right greater than left uncovertebral hypertrophy. No significant spinal stenosis. Moderate right C6 foraminal narrowing. No significant left foraminal stenosis. C6-C7: Minimal disc bulge with uncovertebral hypertrophy. No significant spinal stenosis. Foramina remain patent. C7-T1:  Unremarkable. Visualized upper thoracic spine within normal limits. MRI LUMBAR SPINE FINDINGS Segmentation: Examination degraded by motion artifact. Normal segmentation. Lowest well-formed disc labeled the L5-S1 level. Alignment: Vertebral bodies normally aligned with preservation of the normal lumbar lordosis. No listhesis. Vertebrae: Vertebral body heights maintained without evidence for acute or chronic fracture. Bone marrow signal intensity within normal limits. No discrete or worrisome osseous lesions. Minimal reactive endplate changes noted about the L1-2 interspace anteriorly. No other abnormal marrow edema or enhancement. Cord: Conus medullaris terminates at the L1 level. Visualized distal cord normal in appearance. Nerve roots of  the cauda equina within normal limits. Paraspinal soft tissues: Paraspinous soft tissues within normal limits. Visualized visceral structures unremarkable. Disc levels: L1-2:  Unremarkable. L2-3:  Unremarkable. L3-4: Negative interspace. Mild facet hypertrophy. No significant stenosis or impingement. L4-5: Disc desiccation with associated small central disc protrusion mildly indents the ventral thecal sac. Mild bilateral facet hypertrophy. Resultant mild canal with bilateral lateral recess narrowing. Foramina remain patent. L5-S1: Disc desiccation. Shallow central disc protrusion mildly indents the ventral thecal sac, closely approximating the descending S1 nerve roots bilaterally. No frank neural impingement or displacement. Minimal facet hypertrophy. Central canal remains widely patent. No foraminal stenosis. IMPRESSION: MRI HEAD IMPRESSION: 1. Advanced cerebral white matter changes involving the supratentorial and infratentorial brain, compatible with history of multiple sclerosis. No evidence for active demyelination. 2. 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 3. Advanced cerebral atrophy for age. MRI CERVICAL SPINE IMPRESSION: 1. Abnormal cord signal abnormality involving the central and left aspect of the cord at the level of C4-5, consistent with history of demyelinating disease/multiple sclerosis. Associated postcontrast enhancement compatible with active demyelination. 2. Uncovertebral disease at C5-6 with resultant moderate right C6 foraminal stenosis. MRI LUMBAR SPINE IMPRESSION: 1. Normal MRI appearance of the distal cord/conus medullaris. 2. Small central disc protrusion with facet hypertrophy at L4-5 with resultant mild canal with bilateral lateral recess stenosis. 3. Small central disc protrusion at L5-S1, closely approximating the descending S1 nerve roots without frank neural impingement. Electronically Signed   By: Jeannine Boga M.D.   On: 04/24/2018 00:50   Mr Cervical  Spine W Wo Contrast  Result Date: 04/24/2018 CLINICAL DATA:  Initial evaluation for acute left-sided weakness, history of multiple sclerosis. EXAM: MRI HEAD WITHOUT AND WITH CONTRAST MRI CERVICAL SPINE WITHOUT AND WITH CONTRAST MRI LUMBAR SPINE WITHOUT AND WITH CONTRAST TECHNIQUE: Multiplanar, multiecho pulse sequences of the brain and surrounding structures, and cervical spine, to include the craniocervical junction and cervicothoracic junction, as well as the lumbar spine were obtained without and with intravenous contrast. CONTRAST:  10 cc of Gadavist. COMPARISON:  None available. FINDINGS: MRI HEAD FINDINGS Brain: Diffuse prominence of the CSF containing spaces compatible with generalized cerebral atrophy, advanced for age. Asymmetric atrophy noted involving the left midbrain/cerebral peduncle. Multifocal patchy T2/FLAIR hyperintense lesions seen involving the periventricular, deep, and subcortical white matter both cerebral hemispheres, advanced for age. Overall distribution compatible with known history of multiple sclerosis. Few patchy infratentorial lesions noted as well. No associated restricted diffusion or enhancement to suggest active demyelination. No evidence for acute or subacute infarct. Few small chronic left cerebellar infarcts noted. No evidence for acute or chronic intracranial hemorrhage. No mass lesion, midline shift or mass effect. No hydrocephalus. No extra-axial fluid collection. No other abnormal enhancement. Pituitary gland within normal limits. Midline structures intact. Vascular: Major intracranial vascular flow voids maintained. Skull and upper cervical spine: Craniocervical junction within normal limits. Bone marrow signal intensity normal. Scalp soft tissues within normal limits. Sinuses/Orbits: Globes and orbital soft tissues within normal limits. Paranasal sinuses are clear. No mastoid effusion. Inner ear structures normal. Other: None. MRI CERVICAL SPINE FINDINGS Alignment:  Vertebral bodies normally aligned with preservation of the normal cervical lordosis. No listhesis. Vertebrae: Vertebral body heights maintained without evidence for acute or chronic fracture. Bone marrow signal intensity within normal limits. No discrete or worrisome osseous lesions. Minimal reactive endplate changes present about the C5-6 interspace. No other abnormal marrow edema. Cord: Abnormal T2 signal abnormality seen within the central and left aspect of the cord at the level of C4-5 (series 9, image 16, 13). Lesion measures approximately 16 mm in craniocaudad dimension (series 5, image 9). Associated patchy post-contrast enhancement with cord expansion,  compatible with active demyelination (series 12, image 10). No other definite cord signal abnormality identified. No other abnormal enhancement. Posterior Fossa, vertebral arteries, paraspinal tissues: Craniocervical junction normal. Paraspinous and prevertebral soft tissues within normal limits. Normal intravascular flow voids seen within the vertebral arteries bilaterally. Disc levels: C2-C3: Unremarkable. C3-C4:  Mild disc bulge.  No canal or foraminal stenosis. C4-C5: Minimal uncovertebral hypertrophy. No significant canal or foraminal stenosis. C5-C6: Right greater than left uncovertebral hypertrophy. No significant spinal stenosis. Moderate right C6 foraminal narrowing. No significant left foraminal stenosis. C6-C7: Minimal disc bulge with uncovertebral hypertrophy. No significant spinal stenosis. Foramina remain patent. C7-T1:  Unremarkable. Visualized upper thoracic spine within normal limits. MRI LUMBAR SPINE FINDINGS Segmentation: Examination degraded by motion artifact. Normal segmentation. Lowest well-formed disc labeled the L5-S1 level. Alignment: Vertebral bodies normally aligned with preservation of the normal lumbar lordosis. No listhesis. Vertebrae: Vertebral body heights maintained without evidence for acute or chronic fracture. Bone marrow  signal intensity within normal limits. No discrete or worrisome osseous lesions. Minimal reactive endplate changes noted about the L1-2 interspace anteriorly. No other abnormal marrow edema or enhancement. Cord: Conus medullaris terminates at the L1 level. Visualized distal cord normal in appearance. Nerve roots of the cauda equina within normal limits. Paraspinal soft tissues: Paraspinous soft tissues within normal limits. Visualized visceral structures unremarkable. Disc levels: L1-2:  Unremarkable. L2-3:  Unremarkable. L3-4: Negative interspace. Mild facet hypertrophy. No significant stenosis or impingement. L4-5: Disc desiccation with associated small central disc protrusion mildly indents the ventral thecal sac. Mild bilateral facet hypertrophy. Resultant mild canal with bilateral lateral recess narrowing. Foramina remain patent. L5-S1: Disc desiccation. Shallow central disc protrusion mildly indents the ventral thecal sac, closely approximating the descending S1 nerve roots bilaterally. No frank neural impingement or displacement. Minimal facet hypertrophy. Central canal remains widely patent. No foraminal stenosis. IMPRESSION: MRI HEAD IMPRESSION: 1. Advanced cerebral white matter changes involving the supratentorial and infratentorial brain, compatible with history of multiple sclerosis. No evidence for active demyelination. 2. 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 3. Advanced cerebral atrophy for age. MRI CERVICAL SPINE IMPRESSION: 1. Abnormal cord signal abnormality involving the central and left aspect of the cord at the level of C4-5, consistent with history of demyelinating disease/multiple sclerosis. Associated postcontrast enhancement compatible with active demyelination. 2. Uncovertebral disease at C5-6 with resultant moderate right C6 foraminal stenosis. MRI LUMBAR SPINE IMPRESSION: 1. Normal MRI appearance of the distal cord/conus medullaris. 2. Small central disc protrusion with  facet hypertrophy at L4-5 with resultant mild canal with bilateral lateral recess stenosis. 3. Small central disc protrusion at L5-S1, closely approximating the descending S1 nerve roots without frank neural impingement. Electronically Signed   By: Jeannine Boga M.D.   On: 04/24/2018 00:50   Mr Lumbar Spine W Wo Contrast  Result Date: 04/24/2018 CLINICAL DATA:  Initial evaluation for acute left-sided weakness, history of multiple sclerosis. EXAM: MRI HEAD WITHOUT AND WITH CONTRAST MRI CERVICAL SPINE WITHOUT AND WITH CONTRAST MRI LUMBAR SPINE WITHOUT AND WITH CONTRAST TECHNIQUE: Multiplanar, multiecho pulse sequences of the brain and surrounding structures, and cervical spine, to include the craniocervical junction and cervicothoracic junction, as well as the lumbar spine were obtained without and with intravenous contrast. CONTRAST:  10 cc of Gadavist. COMPARISON:  None available. FINDINGS: MRI HEAD FINDINGS Brain: Diffuse prominence of the CSF containing spaces compatible with generalized cerebral atrophy, advanced for age. Asymmetric atrophy noted involving the left midbrain/cerebral peduncle. Multifocal patchy T2/FLAIR hyperintense lesions seen involving the periventricular, deep, and  subcortical white matter both cerebral hemispheres, advanced for age. Overall distribution compatible with known history of multiple sclerosis. Few patchy infratentorial lesions noted as well. No associated restricted diffusion or enhancement to suggest active demyelination. No evidence for acute or subacute infarct. Few small chronic left cerebellar infarcts noted. No evidence for acute or chronic intracranial hemorrhage. No mass lesion, midline shift or mass effect. No hydrocephalus. No extra-axial fluid collection. No other abnormal enhancement. Pituitary gland within normal limits. Midline structures intact. Vascular: Major intracranial vascular flow voids maintained. Skull and upper cervical spine: Craniocervical  junction within normal limits. Bone marrow signal intensity normal. Scalp soft tissues within normal limits. Sinuses/Orbits: Globes and orbital soft tissues within normal limits. Paranasal sinuses are clear. No mastoid effusion. Inner ear structures normal. Other: None. MRI CERVICAL SPINE FINDINGS Alignment: Vertebral bodies normally aligned with preservation of the normal cervical lordosis. No listhesis. Vertebrae: Vertebral body heights maintained without evidence for acute or chronic fracture. Bone marrow signal intensity within normal limits. No discrete or worrisome osseous lesions. Minimal reactive endplate changes present about the C5-6 interspace. No other abnormal marrow edema. Cord: Abnormal T2 signal abnormality seen within the central and left aspect of the cord at the level of C4-5 (series 9, image 16, 13). Lesion measures approximately 16 mm in craniocaudad dimension (series 5, image 9). Associated patchy post-contrast enhancement with cord expansion, compatible with active demyelination (series 12, image 10). No other definite cord signal abnormality identified. No other abnormal enhancement. Posterior Fossa, vertebral arteries, paraspinal tissues: Craniocervical junction normal. Paraspinous and prevertebral soft tissues within normal limits. Normal intravascular flow voids seen within the vertebral arteries bilaterally. Disc levels: C2-C3: Unremarkable. C3-C4:  Mild disc bulge.  No canal or foraminal stenosis. C4-C5: Minimal uncovertebral hypertrophy. No significant canal or foraminal stenosis. C5-C6: Right greater than left uncovertebral hypertrophy. No significant spinal stenosis. Moderate right C6 foraminal narrowing. No significant left foraminal stenosis. C6-C7: Minimal disc bulge with uncovertebral hypertrophy. No significant spinal stenosis. Foramina remain patent. C7-T1:  Unremarkable. Visualized upper thoracic spine within normal limits. MRI LUMBAR SPINE FINDINGS Segmentation: Examination  degraded by motion artifact. Normal segmentation. Lowest well-formed disc labeled the L5-S1 level. Alignment: Vertebral bodies normally aligned with preservation of the normal lumbar lordosis. No listhesis. Vertebrae: Vertebral body heights maintained without evidence for acute or chronic fracture. Bone marrow signal intensity within normal limits. No discrete or worrisome osseous lesions. Minimal reactive endplate changes noted about the L1-2 interspace anteriorly. No other abnormal marrow edema or enhancement. Cord: Conus medullaris terminates at the L1 level. Visualized distal cord normal in appearance. Nerve roots of the cauda equina within normal limits. Paraspinal soft tissues: Paraspinous soft tissues within normal limits. Visualized visceral structures unremarkable. Disc levels: L1-2:  Unremarkable. L2-3:  Unremarkable. L3-4: Negative interspace. Mild facet hypertrophy. No significant stenosis or impingement. L4-5: Disc desiccation with associated small central disc protrusion mildly indents the ventral thecal sac. Mild bilateral facet hypertrophy. Resultant mild canal with bilateral lateral recess narrowing. Foramina remain patent. L5-S1: Disc desiccation. Shallow central disc protrusion mildly indents the ventral thecal sac, closely approximating the descending S1 nerve roots bilaterally. No frank neural impingement or displacement. Minimal facet hypertrophy. Central canal remains widely patent. No foraminal stenosis. IMPRESSION: MRI HEAD IMPRESSION: 1. Advanced cerebral white matter changes involving the supratentorial and infratentorial brain, compatible with history of multiple sclerosis. No evidence for active demyelination. 2. 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 3. Advanced cerebral atrophy for age. MRI CERVICAL SPINE IMPRESSION: 1. Abnormal cord signal abnormality  involving the central and left aspect of the cord at the level of C4-5, consistent with history of demyelinating  disease/multiple sclerosis. Associated postcontrast enhancement compatible with active demyelination. 2. Uncovertebral disease at C5-6 with resultant moderate right C6 foraminal stenosis. MRI LUMBAR SPINE IMPRESSION: 1. Normal MRI appearance of the distal cord/conus medullaris. 2. Small central disc protrusion with facet hypertrophy at L4-5 with resultant mild canal with bilateral lateral recess stenosis. 3. Small central disc protrusion at L5-S1, closely approximating the descending S1 nerve roots without frank neural impingement. Electronically Signed   By: Jeannine Boga M.D.   On: 04/24/2018 00:50    Medications:  I have reviewed the patient's current medications. Prior to Admission:  Medications Prior to Admission  Medication Sig Dispense Refill Last Dose  . buPROPion (WELLBUTRIN XL) 150 MG 24 hr tablet Take 150 mg by mouth every morning.      Scheduled: . buPROPion  150 mg Oral [id number]  . cholecalciferol  1,000 Units Oral Daily  . cyanocobalamin  1,000 mcg Intramuscular Once  . nicotine  21 mg Transdermal Daily  . vitamin B-12  1,000 mcg Oral Daily   Assessment: 43 y.o with past medical history of tobacco use, relapsing remitting multiple sclerosis currently not on disease modifying drugs presenting to the ED on 04/23/2018 with chief complaints of worsening upper and lower extremity weakness resulting in a fall.  Likely MS exacerbation possible progression. Patient with remote history of MS, unclear when the diagnosis was made. He report that he was on Rebiff trial at [facility] but was taken off the medication due to ineffectiveness. MRI brain reviewed and shows advanced cerebral white matter changes involving the supratentorial and infratentorial brain consistent with demyelinating disease, no evidence of active lesions noted.  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  MRI cervical spine reviewed and shows abnormal cord signal involving the central and left aspect of the  cord at the level of C4-5, postcontrast enhancement compatible with active demyelination.  No acute abnormality noted on MRI lumbar spine.  Labs reviewed and shows low vitamin B12, normal folate, ANA, ESR, RF, HIV and RPR.  Pending further work-up.  Plan: 1. Start Solu-Medrol 1 g IV daily x3  2. Vitamin B12 1000 mcg IM once, and continue maintenance dose of 1000 mcg oral daily 3. LP : CSF oligoclonal bands, IgG index, cell count, protein, glucose pending 4.  Start vitamin D3 1000 units oral daily 5. PT/OT to evaluate 6.  Smoking cessation counseling 7.  Check labs: JC virus, patient will need to follow-up with outpatient neurology for consideration of disease modifying medication for MS.  This patient was staffed with Dr. Irish Elders, A[facility] Frame who personally evaluated patient, reviewed documentation and agreed with assessment and plan of care as above.  Rufina Falco, DNP, FNP-BC Board certified Nurse Practitioner Neurology Department   LOS: 1 day   04/24/2018  11:03 AM   MD: agree with above. MS treatment with solumedrol for 3 days.   S/p LP awaiting oligoclonal bands.

## 2018-04-24 NOTE — Progress Notes (Signed)
Rehab Admissions Coordinator Note:  Patient was screened by Clois Dupes for appropriateness for an Inpatient Acute Rehab Admission at [facility] Noted admission 3/19 for a possible MS flare. Plans for high dose steroids for 3 days.  At this time, we are recommending to follow his progress as he receives steroids to assist with planning dispo pending his functional recovery. I do recommend OT eval. I will follow up on Monday.Clois Dupes 04/24/2018, 1:50 PM  I can be reached at [phone].

## 2018-04-24 NOTE — Progress Notes (Signed)
[facility] [place] [facility]   PATIENT NAME: Evan Reynolds    MR#:  [id number]  DATE OF BIRTH:  [birth year]  SUBJECTIVE: 43 year old male patient admitted for possible MS flare with both upper and lower extremity weakness left more than right.  Noted to drag his left foot with physical therapy.  Had LP done but results are pending.  CHIEF COMPLAINT:   Chief Complaint  Patient presents with  . Fall    REVIEW OF SYSTEMS:   ROS CONSTITUTIONAL: No fever, fatigue or weakness.  EYES: No blurred or double vision.  EARS, NOSE, AND THROAT: No tinnitus or ear pain.  RESPIRATORY: No cough, shortness of breath, wheezing or hemoptysis.  CARDIOVASCULAR: No chest pain, orthopnea, edema.  GASTROINTESTINAL: No nausea, vomiting, diarrhea or abdominal pain.  GENITOURINARY: No dysuria, hematuria.  ENDOCRINE: No polyuria, nocturia,  HEMATOLOGY: No anemia, easy bruising or bleeding SKIN: No rash or lesion. MUSCULOSKELETAL: No joint pain or arthritis.   NEUROLOGIC: No tingling, numbness, weakness.  PSYCHIATRY: No anxiety or depression.   DRUG ALLERGIES:   Allergies  Allergen Reactions  . Penicillins     VITALS:  Blood pressure 127/81, pulse 64, temperature 97.9 F (36.6 C), temperature source Oral, resp. rate 17, height  (1.854 m), weight 111.1 kg, SpO2 99 %.  PHYSICAL EXAMINATION:  GENERAL:  43 y.o.-year-old patient lying in the bed with no acute distress.  EYES: Pupils equal, round, reactive to light and accommodation. No scleral icterus. Extraocular muscles intact.  HEENT: Head atraumatic, normocephalic. Oropharynx and nasopharynx clear.  NECK:  Supple, no jugular venous distention. No thyroid enlargement, no tenderness.  LUNGS: Normal breath sounds bilaterally, no wheezing, rales,rhonchi or crepitation. No use of accessory muscles of respiration.  CARDIOVASCULAR: S1, S2 normal. No murmurs, rubs, or gallops.  ABDOMEN: Soft, nontender, nondistended. Bowel  sounds present. No organomegaly or mass.  EXTREMITIES: No pedal edema, cyanosis, or clubbing.  NEUROLOGIC: Cranial nerves II through XII are intact.  Diminished weakness of both sides right and left side with decreased power pSYCHIATRIC: The patient is alert and oriented x 3.  SKIN: No obvious rash, lesion, or ulcer.    LABORATORY PANEL:   CBC Recent Labs  Lab 04/24/18 0451  WBC 11.1*  HGB 16.3  HCT 48.8  PLT 327   ------------------------------------------------------------------------------------------------------------------  Chemistries  Recent Labs  Lab 04/23/18 1240 04/24/18 0451  NA 140 143  K 4.3 3.9  CL 107 111  CO2 27 23  GLUCOSE 82 98  BUN 13 15  CREATININE 0.86 0.89  CALCIUM 9.1 8.9  AST 19  --   ALT 17  --   ALKPHOS 45  --   BILITOT 0.7  --    ------------------------------------------------------------------------------------------------------------------  Cardiac Enzymes No results for input(s): TROPONINI in the last 168 hours. ------------------------------------------------------------------------------------------------------------------  RADIOLOGY:  Mr Evan Reynolds Contrast  Result Date: 04/24/2018 CLINICAL DATA:  Initial evaluation for acute left-sided weakness, history of multiple sclerosis. EXAM: MRI HEAD WITHOUT AND WITH CONTRAST MRI CERVICAL SPINE WITHOUT AND WITH CONTRAST MRI LUMBAR SPINE WITHOUT AND WITH CONTRAST TECHNIQUE: Multiplanar, multiecho pulse sequences of the brain and surrounding structures, and cervical spine, to include the craniocervical junction and cervicothoracic junction, as well as the lumbar spine were obtained without and with intravenous contrast. CONTRAST:  10 cc of Gadavist. COMPARISON:  None available. FINDINGS: MRI HEAD FINDINGS Brain: Diffuse prominence of the CSF containing spaces compatible with generalized cerebral atrophy, advanced for age. Asymmetric atrophy noted involving the left midbrain/cerebral  peduncle.  Multifocal patchy T2/FLAIR hyperintense lesions seen involving the periventricular, deep, and subcortical white matter both cerebral hemispheres, advanced for age. Overall distribution compatible with known history of multiple sclerosis. Few patchy infratentorial lesions noted as well. No associated restricted diffusion or enhancement to suggest active demyelination. No evidence for acute or subacute infarct. Few small chronic left cerebellar infarcts noted. No evidence for acute or chronic intracranial hemorrhage. No mass lesion, midline shift or mass effect. No hydrocephalus. No extra-axial fluid collection. No other abnormal enhancement. Pituitary gland within normal limits. Midline structures intact. Vascular: Major intracranial vascular flow voids maintained. Skull and upper cervical spine: Craniocervical junction within normal limits. Bone marrow signal intensity normal. Scalp soft tissues within normal limits. Sinuses/Orbits: Globes and orbital soft tissues within normal limits. Paranasal sinuses are clear. No mastoid effusion. Inner ear structures normal. Other: None. MRI CERVICAL SPINE FINDINGS Alignment: Vertebral bodies normally aligned with preservation of the normal cervical lordosis. No listhesis. Vertebrae: Vertebral body heights maintained without evidence for acute or chronic fracture. Bone marrow signal intensity within normal limits. No discrete or worrisome osseous lesions. Minimal reactive endplate changes present about the C5-6 interspace. No other abnormal marrow edema. Cord: Abnormal T2 signal abnormality seen within the central and left aspect of the cord at the level of C4-5 (series 9, image 16, 13). Lesion measures approximately 16 mm in craniocaudad dimension (series 5, image 9). Associated patchy post-contrast enhancement with cord expansion, compatible with active demyelination (series 12, image 10). No other definite cord signal abnormality identified. No other abnormal enhancement.  Posterior Fossa, vertebral arteries, paraspinal tissues: Craniocervical junction normal. Paraspinous and prevertebral soft tissues within normal limits. Normal intravascular flow voids seen within the vertebral arteries bilaterally. Disc levels: C2-C3: Unremarkable. C3-C4:  Mild disc bulge.  No canal or foraminal stenosis. C4-C5: Minimal uncovertebral hypertrophy. No significant canal or foraminal stenosis. C5-C6: Right greater than left uncovertebral hypertrophy. No significant spinal stenosis. Moderate right C6 foraminal narrowing. No significant left foraminal stenosis. C6-C7: Minimal disc bulge with uncovertebral hypertrophy. No significant spinal stenosis. Foramina remain patent. C7-T1:  Unremarkable. Visualized upper thoracic spine within normal limits. MRI LUMBAR SPINE FINDINGS Segmentation: Examination degraded by motion artifact. Normal segmentation. Lowest well-formed disc labeled the L5-S1 level. Alignment: Vertebral bodies normally aligned with preservation of the normal lumbar lordosis. No listhesis. Vertebrae: Vertebral body heights maintained without evidence for acute or chronic fracture. Bone marrow signal intensity within normal limits. No discrete or worrisome osseous lesions. Minimal reactive endplate changes noted about the L1-2 interspace anteriorly. No other abnormal marrow edema or enhancement. Cord: Conus medullaris terminates at the L1 level. Visualized distal cord normal in appearance. Nerve roots of the cauda equina within normal limits. Paraspinal soft tissues: Paraspinous soft tissues within normal limits. Visualized visceral structures unremarkable. Disc levels: L1-2:  Unremarkable. L2-3:  Unremarkable. L3-4: Negative interspace. Mild facet hypertrophy. No significant stenosis or impingement. L4-5: Disc desiccation with associated small central disc protrusion mildly indents the ventral thecal sac. Mild bilateral facet hypertrophy. Resultant mild canal with bilateral lateral recess  narrowing. Foramina remain patent. L5-S1: Disc desiccation. Shallow central disc protrusion mildly indents the ventral thecal sac, closely approximating the descending S1 nerve roots bilaterally. No frank neural impingement or displacement. Minimal facet hypertrophy. Central canal remains widely patent. No foraminal stenosis. IMPRESSION: MRI HEAD IMPRESSION: 1. Advanced cerebral white matter changes involving the supratentorial and infratentorial brain, compatible with history of multiple sclerosis. No evidence for active demyelination. 2. 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 3. Advanced  cerebral atrophy for age. MRI CERVICAL SPINE IMPRESSION: 1. Abnormal cord signal abnormality involving the central and left aspect of the cord at the level of C4-5, consistent with history of demyelinating disease/multiple sclerosis. Associated postcontrast enhancement compatible with active demyelination. 2. Uncovertebral disease at C5-6 with resultant moderate right C6 foraminal stenosis. MRI LUMBAR SPINE IMPRESSION: 1. Normal MRI appearance of the distal cord/conus medullaris. 2. Small central disc protrusion with facet hypertrophy at L4-5 with resultant mild canal with bilateral lateral recess stenosis. 3. Small central disc protrusion at L5-S1, closely approximating the descending S1 nerve roots without frank neural impingement. Electronically Signed   By: Rise Mu M.D.   On: 04/24/2018 00:50   Mr Cervical Spine W Reynolds Contrast  Result Date: 04/24/2018 CLINICAL DATA:  Initial evaluation for acute left-sided weakness, history of multiple sclerosis. EXAM: MRI HEAD WITHOUT AND WITH CONTRAST MRI CERVICAL SPINE WITHOUT AND WITH CONTRAST MRI LUMBAR SPINE WITHOUT AND WITH CONTRAST TECHNIQUE: Multiplanar, multiecho pulse sequences of the brain and surrounding structures, and cervical spine, to include the craniocervical junction and cervicothoracic junction, as well as the lumbar spine were obtained  without and with intravenous contrast. CONTRAST:  10 cc of Gadavist. COMPARISON:  None available. FINDINGS: MRI HEAD FINDINGS Brain: Diffuse prominence of the CSF containing spaces compatible with generalized cerebral atrophy, advanced for age. Asymmetric atrophy noted involving the left midbrain/cerebral peduncle. Multifocal patchy T2/FLAIR hyperintense lesions seen involving the periventricular, deep, and subcortical white matter both cerebral hemispheres, advanced for age. Overall distribution compatible with known history of multiple sclerosis. Few patchy infratentorial lesions noted as well. No associated restricted diffusion or enhancement to suggest active demyelination. No evidence for acute or subacute infarct. Few small chronic left cerebellar infarcts noted. No evidence for acute or chronic intracranial hemorrhage. No mass lesion, midline shift or mass effect. No hydrocephalus. No extra-axial fluid collection. No other abnormal enhancement. Pituitary gland within normal limits. Midline structures intact. Vascular: Major intracranial vascular flow voids maintained. Skull and upper cervical spine: Craniocervical junction within normal limits. Bone marrow signal intensity normal. Scalp soft tissues within normal limits. Sinuses/Orbits: Globes and orbital soft tissues within normal limits. Paranasal sinuses are clear. No mastoid effusion. Inner ear structures normal. Other: None. MRI CERVICAL SPINE FINDINGS Alignment: Vertebral bodies normally aligned with preservation of the normal cervical lordosis. No listhesis. Vertebrae: Vertebral body heights maintained without evidence for acute or chronic fracture. Bone marrow signal intensity within normal limits. No discrete or worrisome osseous lesions. Minimal reactive endplate changes present about the C5-6 interspace. No other abnormal marrow edema. Cord: Abnormal T2 signal abnormality seen within the central and left aspect of the cord at the level of C4-5  (series 9, image 16, 13). Lesion measures approximately 16 mm in craniocaudad dimension (series 5, image 9). Associated patchy post-contrast enhancement with cord expansion, compatible with active demyelination (series 12, image 10). No other definite cord signal abnormality identified. No other abnormal enhancement. Posterior Fossa, vertebral arteries, paraspinal tissues: Craniocervical junction normal. Paraspinous and prevertebral soft tissues within normal limits. Normal intravascular flow voids seen within the vertebral arteries bilaterally. Disc levels: C2-C3: Unremarkable. C3-C4:  Mild disc bulge.  No canal or foraminal stenosis. C4-C5: Minimal uncovertebral hypertrophy. No significant canal or foraminal stenosis. C5-C6: Right greater than left uncovertebral hypertrophy. No significant spinal stenosis. Moderate right C6 foraminal narrowing. No significant left foraminal stenosis. C6-C7: Minimal disc bulge with uncovertebral hypertrophy. No significant spinal stenosis. Foramina remain patent. C7-T1:  Unremarkable. Visualized upper thoracic spine within normal limits. MRI  LUMBAR SPINE FINDINGS Segmentation: Examination degraded by motion artifact. Normal segmentation. Lowest well-formed disc labeled the L5-S1 level. Alignment: Vertebral bodies normally aligned with preservation of the normal lumbar lordosis. No listhesis. Vertebrae: Vertebral body heights maintained without evidence for acute or chronic fracture. Bone marrow signal intensity within normal limits. No discrete or worrisome osseous lesions. Minimal reactive endplate changes noted about the L1-2 interspace anteriorly. No other abnormal marrow edema or enhancement. Cord: Conus medullaris terminates at the L1 level. Visualized distal cord normal in appearance. Nerve roots of the cauda equina within normal limits. Paraspinal soft tissues: Paraspinous soft tissues within normal limits. Visualized visceral structures unremarkable. Disc levels: L1-2:   Unremarkable. L2-3:  Unremarkable. L3-4: Negative interspace. Mild facet hypertrophy. No significant stenosis or impingement. L4-5: Disc desiccation with associated small central disc protrusion mildly indents the ventral thecal sac. Mild bilateral facet hypertrophy. Resultant mild canal with bilateral lateral recess narrowing. Foramina remain patent. L5-S1: Disc desiccation. Shallow central disc protrusion mildly indents the ventral thecal sac, closely approximating the descending S1 nerve roots bilaterally. No frank neural impingement or displacement. Minimal facet hypertrophy. Central canal remains widely patent. No foraminal stenosis. IMPRESSION: MRI HEAD IMPRESSION: 1. Advanced cerebral white matter changes involving the supratentorial and infratentorial brain, compatible with history of multiple sclerosis. No evidence for active demyelination. 2. 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 3. Advanced cerebral atrophy for age. MRI CERVICAL SPINE IMPRESSION: 1. Abnormal cord signal abnormality involving the central and left aspect of the cord at the level of C4-5, consistent with history of demyelinating disease/multiple sclerosis. Associated postcontrast enhancement compatible with active demyelination. 2. Uncovertebral disease at C5-6 with resultant moderate right C6 foraminal stenosis. MRI LUMBAR SPINE IMPRESSION: 1. Normal MRI appearance of the distal cord/conus medullaris. 2. Small central disc protrusion with facet hypertrophy at L4-5 with resultant mild canal with bilateral lateral recess stenosis. 3. Small central disc protrusion at L5-S1, closely approximating the descending S1 nerve roots without frank neural impingement. Electronically Signed   By: Rise Mu M.D.   On: 04/24/2018 00:50   Mr Lumbar Spine W Reynolds Contrast  Result Date: 04/24/2018 CLINICAL DATA:  Initial evaluation for acute left-sided weakness, history of multiple sclerosis. EXAM: MRI HEAD WITHOUT AND WITH  CONTRAST MRI CERVICAL SPINE WITHOUT AND WITH CONTRAST MRI LUMBAR SPINE WITHOUT AND WITH CONTRAST TECHNIQUE: Multiplanar, multiecho pulse sequences of the brain and surrounding structures, and cervical spine, to include the craniocervical junction and cervicothoracic junction, as well as the lumbar spine were obtained without and with intravenous contrast. CONTRAST:  10 cc of Gadavist. COMPARISON:  None available. FINDINGS: MRI HEAD FINDINGS Brain: Diffuse prominence of the CSF containing spaces compatible with generalized cerebral atrophy, advanced for age. Asymmetric atrophy noted involving the left midbrain/cerebral peduncle. Multifocal patchy T2/FLAIR hyperintense lesions seen involving the periventricular, deep, and subcortical white matter both cerebral hemispheres, advanced for age. Overall distribution compatible with known history of multiple sclerosis. Few patchy infratentorial lesions noted as well. No associated restricted diffusion or enhancement to suggest active demyelination. No evidence for acute or subacute infarct. Few small chronic left cerebellar infarcts noted. No evidence for acute or chronic intracranial hemorrhage. No mass lesion, midline shift or mass effect. No hydrocephalus. No extra-axial fluid collection. No other abnormal enhancement. Pituitary gland within normal limits. Midline structures intact. Vascular: Major intracranial vascular flow voids maintained. Skull and upper cervical spine: Craniocervical junction within normal limits. Bone marrow signal intensity normal. Scalp soft tissues within normal limits. Sinuses/Orbits: Globes and orbital soft tissues  within normal limits. Paranasal sinuses are clear. No mastoid effusion. Inner ear structures normal. Other: None. MRI CERVICAL SPINE FINDINGS Alignment: Vertebral bodies normally aligned with preservation of the normal cervical lordosis. No listhesis. Vertebrae: Vertebral body heights maintained without evidence for acute or chronic  fracture. Bone marrow signal intensity within normal limits. No discrete or worrisome osseous lesions. Minimal reactive endplate changes present about the C5-6 interspace. No other abnormal marrow edema. Cord: Abnormal T2 signal abnormality seen within the central and left aspect of the cord at the level of C4-5 (series 9, image 16, 13). Lesion measures approximately 16 mm in craniocaudad dimension (series 5, image 9). Associated patchy post-contrast enhancement with cord expansion, compatible with active demyelination (series 12, image 10). No other definite cord signal abnormality identified. No other abnormal enhancement. Posterior Fossa, vertebral arteries, paraspinal tissues: Craniocervical junction normal. Paraspinous and prevertebral soft tissues within normal limits. Normal intravascular flow voids seen within the vertebral arteries bilaterally. Disc levels: C2-C3: Unremarkable. C3-C4:  Mild disc bulge.  No canal or foraminal stenosis. C4-C5: Minimal uncovertebral hypertrophy. No significant canal or foraminal stenosis. C5-C6: Right greater than left uncovertebral hypertrophy. No significant spinal stenosis. Moderate right C6 foraminal narrowing. No significant left foraminal stenosis. C6-C7: Minimal disc bulge with uncovertebral hypertrophy. No significant spinal stenosis. Foramina remain patent. C7-T1:  Unremarkable. Visualized upper thoracic spine within normal limits. MRI LUMBAR SPINE FINDINGS Segmentation: Examination degraded by motion artifact. Normal segmentation. Lowest well-formed disc labeled the L5-S1 level. Alignment: Vertebral bodies normally aligned with preservation of the normal lumbar lordosis. No listhesis. Vertebrae: Vertebral body heights maintained without evidence for acute or chronic fracture. Bone marrow signal intensity within normal limits. No discrete or worrisome osseous lesions. Minimal reactive endplate changes noted about the L1-2 interspace anteriorly. No other abnormal  marrow edema or enhancement. Cord: Conus medullaris terminates at the L1 level. Visualized distal cord normal in appearance. Nerve roots of the cauda equina within normal limits. Paraspinal soft tissues: Paraspinous soft tissues within normal limits. Visualized visceral structures unremarkable. Disc levels: L1-2:  Unremarkable. L2-3:  Unremarkable. L3-4: Negative interspace. Mild facet hypertrophy. No significant stenosis or impingement. L4-5: Disc desiccation with associated small central disc protrusion mildly indents the ventral thecal sac. Mild bilateral facet hypertrophy. Resultant mild canal with bilateral lateral recess narrowing. Foramina remain patent. L5-S1: Disc desiccation. Shallow central disc protrusion mildly indents the ventral thecal sac, closely approximating the descending S1 nerve roots bilaterally. No frank neural impingement or displacement. Minimal facet hypertrophy. Central canal remains widely patent. No foraminal stenosis. IMPRESSION: MRI HEAD IMPRESSION: 1. Advanced cerebral white matter changes involving the supratentorial and infratentorial brain, compatible with history of multiple sclerosis. No evidence for active demyelination. 2. 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 3. Advanced cerebral atrophy for age. MRI CERVICAL SPINE IMPRESSION: 1. Abnormal cord signal abnormality involving the central and left aspect of the cord at the level of C4-5, consistent with history of demyelinating disease/multiple sclerosis. Associated postcontrast enhancement compatible with active demyelination. 2. Uncovertebral disease at C5-6 with resultant moderate right C6 foraminal stenosis. MRI LUMBAR SPINE IMPRESSION: 1. Normal MRI appearance of the distal cord/conus medullaris. 2. Small central disc protrusion with facet hypertrophy at L4-5 with resultant mild canal with bilateral lateral recess stenosis. 3. Small central disc protrusion at L5-S1, closely approximating the descending S1  nerve roots without frank neural impingement. Electronically Signed   By: Rise Mu M.D.   On: 04/24/2018 00:50   Dg Fl Guided Lumbar Puncture  Result Date: 04/24/2018 CLINICAL  DATA:  Multiple sclerosis. Patient reports he began experiencing left side numbness x 3 days ago. Reports he was diagnosed with multiple sclerosis 20 years ago and he has previously experienced right sided numbness. EXAM: DIAGNOSTIC LUMBAR PUNCTURE UNDER FLUOROSCOPIC GUIDANCE FLUOROSCOPY TIME:  Fluoroscopy Time:  12 seconds PROCEDURE: Informed consent was obtained from the patient prior to the procedure, including potential complications of headache, allergy, and pain. With the patient prone, the lower back was prepped with Betadine. 1% Lidocaine was used for local anesthesia. Lumbar puncture was performed at the L4-5 level using a 22 gauge needle with return of clear CSF. 8 ml of CSF were obtained for laboratory studies. The patient tolerated the procedure well and there were no apparent complications. Patient was transported back to the patient's room in stable condition. IMPRESSION: Successful fluoroscopic guided diagnostic lumbar puncture. Electronically Signed   By: Elige KoHetal  Patel   On: 04/24/2018 11:03    EKG:   Orders placed or performed in visit on 07/22/07  . EKG 12-Lead    ASSESSMENT AND PLAN:   43 year old with history of multiple sclerosis but not on any medication comes in because of worsening upper and lower extremity weakness resulting in fall, patient had remote history of MS , [facility] but taken off this medicine due to ineffectiveness. #1. possible MS flare, patient MRI of cervical spine showed abnormal cord signal involvement of central left aspect of the cord at the level of C4,, C5 consistent with acute demyelination, CSF sent to evaluate for oligoclonal bands, patient had LP done this morning, results are pending.  Spoke with neurology, recommends high-dose steroids at least for  3 days, 2.  B12 deficiency anemia, started B12 IM, p.o. supplements #3 /because of weakness in legs and hands JC virus serology also is sent .  Patient may also need disease modifying agent as an outpatient once this acute episode resolves. All the records are reviewed and case discussed with Care Management/Social Workerr. Management plans discussed with the patient, family and they are in agreement.  CODE STATUS: Full code  TOTAL TIME TAKING CARE OF THIS PATIENT: 38 minutes.   POSSIBLE D/C IN 4-5  DAYS, DEPENDING ON CLINICAL CONDITION. With neurology, patient needs IV steroids at least for 3 days and reassess on Monday to see how he is doing. 50% of time spent in counseling, coordination of care Katha HammingSnehalatha Marlyne Totaro M.D on 04/24/2018 at 12:32 PM  Between 7am to 6pm - Pager - [phone]  After 6pm go to [email or web address] - password [email or web address]  [facility]  Office  [phone]   CC: Primary care physician; Patient, No Pcp Per   Note: This dictation was prepared with Dragon dictation along with smaller phrase technology. Any transcriptional errors that result from this process are unintentional.

## 2018-04-24 NOTE — Evaluation (Signed)
Physical Therapy Evaluation Patient Details Name: Evan Reynolds. MRN: [id number] DOB: [birth year] Today's Date: 04/24/2018   History of Present Illness  Pt is a 43 y.o. male presenting to hospital 04/23/18 with increasing UE/LE weakness resulting in fall (L>R weakness).  PMH includes relapsing remitting MS.  Clinical Impression  Prior to hospital admission, pt was independent with ambulation; on disability.  Pt lives with his girlfriend in basement apt with 1 step down with railing to enter.  Currently pt is min assist with bed mobility, transfers, and ambulating 20 feet with RW.  L sided weakness noted; L DF 1/5 (pt slid L foot forward on floor to advance but no ankle instability noted during ambulation; L knee instability noted with increased distance ambulating).  Pt demonstrating fair L hand strength complicating ability to stand but pt able to safely hold onto walker during ambulation trial.  No c/o pain.  Pt would benefit from skilled PT to address noted impairments and functional limitations (see below for any additional details).  Upon hospital discharge, recommend pt discharge to CIR.    Follow Up Recommendations CIR    Equipment Recommendations  Rolling walker with 5" wheels    Recommendations for Other Services Rehab consult     Precautions / Restrictions Precautions Precautions: Fall Restrictions Weight Bearing Restrictions: No      Mobility  Bed Mobility Overal bed mobility: Needs Assistance Bed Mobility: Supine to Sit     Supine to sit: Min assist;HOB elevated     General bed mobility comments: minimal assist for L LE  Transfers Overall transfer level: Needs assistance Equipment used: Rolling walker (2 wheeled) Transfers: Sit to/from [employer] Sit to Stand: Min assist(assist to stand from bed and from recliner) Stand pivot transfers: Min guard;Min assist(bed to recliner)       General transfer comment: vc's for UE/LE  positioning  Ambulation/Gait Ambulation/Gait assistance: Min guard;Min assist Gait Distance (Feet): 20 Feet Assistive device: Rolling walker (2 wheeled)   Gait velocity: decreased   General Gait Details: vc's for walker positioning/use (and to stay closer to RW); pt sliding L foot forward on floor to advance L LE (no ankle instability noted); L knee instability noted with increased distance ambulating requiring cueing for L knee positioning (to prevent hyperextension)  Stairs            Wheelchair Mobility    Modified Rankin (Stroke Patients Only)       Balance Overall balance assessment: Needs assistance Sitting-balance support: No upper extremity supported;Feet supported Sitting balance-Leahy Scale: Good Sitting balance - Comments: steady sitting reaching within BOS   Standing balance support: Single extremity supported Standing balance-Leahy Scale: Poor Standing balance comment: pt requiring at least single UE support for static standing balance                             Pertinent Vitals/Pain Pain Assessment: No/denies pain  Vitals (HR and O2 on room air) stable and WFL throughout treatment session.    Home Living Family/patient expects to be discharged to:: Private residence Living Arrangements: Spouse/significant other(girlfriend) Available Help at Discharge: Friend(s) Type of Home: Apartment(basement apt) Home Access: Stairs to enter   Entrance Stairs-Number of Steps: 1 step down with L railing down Home Layout: One level Home Equipment: None      Prior Function Level of Independence: Independent         Comments: On disability.     Hand Dominance  Extremity/Trunk Assessment   Upper Extremity Assessment Upper Extremity Assessment: RUE deficits/detail;LUE deficits/detail RUE Deficits / Details: good R hand grip strength; 4+/5 elbow flexion/extension and shoulder flexion LUE Deficits / Details: decreased sensation L  shoulder to L elbow; fair L hand grip strength; elbow flexion/extension 3+/5; shoulder flexion 3/5 LUE Sensation: decreased light touch    Lower Extremity Assessment Lower Extremity Assessment: RLE deficits/detail;LLE deficits/detail RLE Deficits / Details: hip flexion 4+/5; knee flexion/extension 4+/5; DF 4+/5 RLE Sensation: decreased light touch(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 (pt reports intact to pin pick in the past but not tested today)) LLE Deficits / Details: hip flexion 3-/5; knee extension 3-/5; knee flexion 2+/5; DF 1/5; 8 second clonus noted with quick heelcord stretch LLE Sensation: decreased light touch(baseline neuropathy absent light touch entire L foot (pt reports intact to pin pick in the past but not tested today))    Cervical / Trunk Assessment Cervical / Trunk Assessment: Normal  Communication   Communication: No difficulties  Cognition Arousal/Alertness: Awake/alert Behavior During Therapy: WFL for tasks assessed/performed Overall Cognitive Status: Within Functional Limits for tasks assessed                                        General Comments   Nursing cleared pt for participation in physical therapy.  Pt agreeable to PT session.    Exercises  Gait training   Assessment/Plan    PT Assessment Patient needs continued PT services  PT Problem List Decreased strength;Decreased activity tolerance;Decreased balance;Decreased mobility;Decreased coordination;Decreased knowledge of use of DME;Decreased knowledge of precautions;Impaired sensation;Impaired tone       PT Treatment Interventions DME instruction;Gait training;Stair training;Functional mobility training;Therapeutic activities;Therapeutic exercise;Balance training;Neuromuscular re-education;Patient/family education    PT Goals (Current goals can be found in the Care Plan section)  Acute Rehab PT Goals Patient Stated Goal: to improve strength and walking PT Goal  Formulation: With patient Time For Goal Achievement: 05/08/18 Potential to Achieve Goals: Good    Frequency 7X/week   Barriers to discharge Decreased caregiver support      Co-evaluation               AM-PAC PT "6 Clicks" Mobility  Outcome Measure Help needed turning from your back to your side while in a flat bed without using bedrails?: A Little Help needed moving from lying on your back to sitting on the side of a flat bed without using bedrails?: A Little Help needed moving to and from a bed to a chair (including a wheelchair)?: A Little Help needed standing up from a chair using your arms (e.g., wheelchair or bedside chair)?: A Little Help needed to walk in hospital room?: A Little Help needed climbing 3-5 steps with a railing? : A Lot 6 Click Score: 17    End of Session Equipment Utilized During Treatment: Gait belt Activity Tolerance: Patient tolerated treatment well Patient left: in chair;with call bell/phone within reach;with chair alarm set;with nursing/sitter in room Nurse Communication: Mobility status;Precautions PT Visit Diagnosis: Other abnormalities of gait and mobility (R26.89);Muscle weakness (generalized) (M62.81);Difficulty in walking, not elsewhere classified (R26.2);Other symptoms and signs involving the nervous system (R29.898);Hemiplegia and hemiparesis Hemiplegia - Right/Left: Left Hemiplegia - dominant/non-dominant: Non-dominant Hemiplegia - caused by: Other cerebrovascular disease    Time: 4076-8088 PT Time Calculation (min) (ACUTE ONLY): 43 min   Charges:   PT Evaluation $PT Eval Low Complexity: 1 Low PT  Treatments $Gait Training: 8-22 mins       Hendricks Limes, PT 04/24/18, 10:12 AM [phone]

## 2018-04-24 NOTE — Progress Notes (Signed)
Pt returned from LP, pt pain free. [employer] cdi, notified NP re: WBC of 11

## 2018-04-24 NOTE — Progress Notes (Signed)
Patient complaining of spasms in his legs. MD notified. Orders pending

## 2018-04-25 LAB — VITAMIN D 25 HYDROXY (VIT D DEFICIENCY, FRACTURES): Vit D, 25-Hydroxy: 15.9 ng/mL — ABNORMAL LOW (ref 30.0–100.0)

## 2018-04-25 MED ORDER — FAMOTIDINE IN NACL 20-0.9 MG/50ML-% IV SOLN
20.0000 mg | Freq: Once | INTRAVENOUS | Status: AC
  Administered 2018-04-26: 20 mg via INTRAVENOUS
  Filled 2018-04-25: qty 50

## 2018-04-25 MED ORDER — KETOROLAC TROMETHAMINE 15 MG/ML IJ SOLN
15.0000 mg | Freq: Four times a day (QID) | INTRAMUSCULAR | Status: DC | PRN
  Administered 2018-04-25 – 2018-04-27 (×3): 15 mg via INTRAVENOUS
  Filled 2018-04-25 (×3): qty 1

## 2018-04-25 MED ORDER — ALUM & MAG HYDROXIDE-SIMETH 200-200-20 MG/5ML PO SUSP
30.0000 mL | Freq: Four times a day (QID) | ORAL | Status: DC | PRN
  Administered 2018-04-25 (×2): 30 mL via ORAL
  Filled 2018-04-25 (×2): qty 30

## 2018-04-25 MED ORDER — PANTOPRAZOLE SODIUM 40 MG PO TBEC: 40.0000 mg | DELAYED_RELEASE_TABLET | Freq: Every day | ORAL | Status: DC

## 2018-04-25 MED ORDER — HEPARIN SODIUM (PORCINE) 5000 UNIT/ML IJ SOLN
5000.0000 [IU] | Freq: Three times a day (TID) | INTRAMUSCULAR | Status: DC
  Administered 2018-04-25 – 2018-04-28 (×9): 5000 [IU] via SUBCUTANEOUS
  Filled 2018-04-25 (×9): qty 1

## 2018-04-25 NOTE — Progress Notes (Signed)
Physical Therapy Treatment Patient Details Name: Evan Reynolds. MRN: [id number] DOB: 03/08/1975 Today's Date: 04/25/2018    History of Present Illness Pt is a 43 y.o. male presenting to hospital 04/23/18 with increasing UE/LE weakness resulting in fall (L>R weakness).  PMH includes relapsing remitting MS.    PT Comments    Pt making good progress with therapy. He reports no improvement in strength since yesterday however he is able to ambulate slightly farther today. He is able to perform seated exercises at EOB with notable LLE weakness. Recommendation remains CIR pending how he progresses. Pt will benefit from PT services to address deficits in strength, balance, and mobility in order to return to full function at home.    Follow Up Recommendations  CIR     Equipment Recommendations  Rolling walker with 5" wheels    Recommendations for Other Services Rehab consult     Precautions / Restrictions Precautions Precautions: Fall Restrictions Weight Bearing Restrictions: No    Mobility  Bed Mobility Overal bed mobility: Needs Assistance Bed Mobility: Supine to Sit     Supine to sit: Min assist;HOB elevated     General bed mobility comments: minimal assist for L LE and to scoot toward EOB  Transfers Overall transfer level: Needs assistance Equipment used: Rolling walker (2 wheeled) Transfers: Sit to/from Stand Sit to Stand: Min assist         General transfer comment: vc's for UE/LE positioning as well as safe hand placement. Minimal instability during transfesr with less strength in LLE  Ambulation/Gait Ambulation/Gait assistance: Min assist Gait Distance (Feet): 30 Feet Assistive device: Rolling walker (2 wheeled)   Gait velocity: Below community ambulation speed   General Gait Details: Pt is able to ambulate out into the hallway and back into the bedroom. He requires cues for proper sequencing as well as infrequent assist advancing LLE. L knee instability with  pt working to avoid L knee hyperextension. Pt reports after ambulation that he is very fatigued   Social research officer, government Rankin (Stroke Patients Only)       Balance Overall balance assessment: Needs assistance Sitting-balance support: No upper extremity supported Sitting balance-Leahy Scale: Good     Standing balance support: Bilateral upper extremity supported Standing balance-Leahy Scale: Poor Standing balance comment: pt requiring at least single UE support for static standing balance                            Cognition Arousal/Alertness: Awake/alert Behavior During Therapy: WFL for tasks assessed/performed Overall Cognitive Status: Within Functional Limits for tasks assessed                                        Exercises General Exercises - Lower Extremity Long Arc Quad: Both;10 reps Heel Slides: Both;10 reps Hip ABduction/ADduction: Both;10 reps Hip Flexion/Marching: Both;10 reps Heel Raises: Both;10 reps    General Comments        Pertinent Vitals/Pain Pain Assessment: No/denies pain    Home Living                      Prior Function            PT Goals (current goals can now be found in the care plan section)  Acute Rehab PT Goals Patient Stated Goal: to improve strength and walking PT Goal Formulation: With patient Time For Goal Achievement: 05/08/18 Potential to Achieve Goals: Good Progress towards PT goals: Progressing toward goals    Frequency    7X/week      PT Plan Current plan remains appropriate    Co-evaluation              AM-PAC PT "6 Clicks" Mobility   Outcome Measure  Help needed turning from your back to your side while in a flat bed without using bedrails?: A Little Help needed moving from lying on your back to sitting on the side of a flat bed without using bedrails?: A Little Help needed moving to and from a bed to a chair (including a  wheelchair)?: A Little Help needed standing up from a chair using your arms (e.g., wheelchair or bedside chair)?: A Little Help needed to walk in hospital room?: A Little Help needed climbing 3-5 steps with a railing? : A Lot 6 Click Score: 17    End of Session Equipment Utilized During Treatment: Gait belt Activity Tolerance: Patient tolerated treatment well Patient left: in chair;with call bell/phone within reach;with chair alarm set;with nursing/sitter in room   PT Visit Diagnosis: Other abnormalities of gait and mobility (R26.89);Muscle weakness (generalized) (M62.81);Difficulty in walking, not elsewhere classified (R26.2);Other symptoms and signs involving the nervous system (R29.898);Hemiplegia and hemiparesis Hemiplegia - Right/Left: Left Hemiplegia - dominant/non-dominant: Non-dominant Hemiplegia - caused by: Other cerebrovascular disease     Time: 8413-2440 PT Time Calculation (min) (ACUTE ONLY): 15 min  Charges:  $Therapeutic Exercise: 8-22 mins                     Sharalyn Ink Jance Siek PT, DPT, GCS    Kestrel Mis 04/25/2018, 3:05 PM

## 2018-04-25 NOTE — Progress Notes (Signed)
[employer] [facility] [place] [facility]   PATIENT NAME: Evan Reynolds    MR#:  [id number]  DATE OF BIRTH:  [birth year]  SUBJECTIVE:  CHIEF COMPLAINT:   Chief Complaint  Patient presents with   Fall   -Prominent left-sided weakness noted today.  Also complains of headache.  REVIEW OF SYSTEMS:  Review of Systems  Constitutional: Positive for malaise/fatigue. Negative for chills and fever.  HENT: Negative for congestion, ear discharge, hearing loss and nosebleeds.   Eyes: Negative for blurred vision and double vision.  Respiratory: Negative for cough, shortness of breath and wheezing.   Cardiovascular: Negative for chest pain and palpitations.  Gastrointestinal: Negative for abdominal pain, constipation, diarrhea, nausea and vomiting.  Genitourinary: Negative for dysuria.  Musculoskeletal: Positive for myalgias.  Neurological: Positive for focal weakness and headaches. Negative for dizziness, sensory change, speech change and seizures.  Psychiatric/Behavioral: Negative for depression.    DRUG ALLERGIES:   Allergies  Allergen Reactions   Penicillins     VITALS:  Blood pressure 121/85, pulse 79, temperature (!) 97.5 F (36.4 C), temperature source Oral, resp. rate 19, height 6\' 1"  (1.854 m), weight 111.1 kg, SpO2 98 %.  PHYSICAL EXAMINATION:  Physical Exam   GENERAL:  43 y.o.-year-old patient lying in the bed with no acute distress.  EYES: Pupils equal, round, reactive to light and accommodation. No scleral icterus. Extraocular muscles intact.  HEENT: Head atraumatic, normocephalic. Oropharynx and nasopharynx clear.  NECK:  Supple, no jugular venous distention. No thyroid enlargement, no tenderness.  LUNGS: Normal breath sounds bilaterally, no wheezing, rales,rhonchi or crepitation. No use of accessory muscles of respiration.  CARDIOVASCULAR: S1, S2 normal. No murmurs, rubs, or gallops.  ABDOMEN: Soft, nontender, nondistended. Bowel sounds present. No  organomegaly or mass.  EXTREMITIES: No pedal edema, cyanosis, or clubbing.  NEUROLOGIC: Cranial nerves II through XII are intact.  Right arm strength is 5/5, right lower extremity strength is 5/5 except mild foot drop noted.  Left upper extremity strength is 4/5 in left lower extremity strength is 2/5.  Sensation intact. Gait not checked.  PSYCHIATRIC: The patient is alert and oriented x 3.  SKIN: No obvious rash, lesion, or ulcer.    LABORATORY PANEL:   CBC Recent Labs  Lab 04/24/18 0451  WBC 11.1*  HGB 16.3  HCT 48.8  PLT 327   ------------------------------------------------------------------------------------------------------------------  Chemistries  Recent Labs  Lab 04/23/18 1240 04/24/18 0451  NA 140 143  K 4.3 3.9  CL 107 111  CO2 27 23  GLUCOSE 82 98  BUN 13 15  CREATININE 0.86 0.89  CALCIUM 9.1 8.9  AST 19  --   ALT 17  --   ALKPHOS 45  --   BILITOT 0.7  --    ------------------------------------------------------------------------------------------------------------------  Cardiac Enzymes No results for input(s): TROPONINI in the last 168 hours. ------------------------------------------------------------------------------------------------------------------  RADIOLOGY:  Mr Laqueta Jean Wo Contrast  Result Date: 04/24/2018 CLINICAL DATA:  Initial evaluation for acute left-sided weakness, history of multiple sclerosis. EXAM: MRI HEAD WITHOUT AND WITH CONTRAST MRI CERVICAL SPINE WITHOUT AND WITH CONTRAST MRI LUMBAR SPINE WITHOUT AND WITH CONTRAST TECHNIQUE: Multiplanar, multiecho pulse sequences of the brain and surrounding structures, and cervical spine, to include the craniocervical junction and cervicothoracic junction, as well as the lumbar spine were obtained without and with intravenous contrast. CONTRAST:  10 cc of Gadavist. COMPARISON:  None available. FINDINGS: MRI HEAD FINDINGS Brain: Diffuse prominence of the CSF containing spaces compatible with  generalized cerebral atrophy, advanced for  age. Asymmetric atrophy noted involving the left midbrain/cerebral peduncle. Multifocal patchy T2/FLAIR hyperintense lesions seen involving the periventricular, deep, and subcortical white matter both cerebral hemispheres, advanced for age. Overall distribution compatible with known history of multiple sclerosis. Few patchy infratentorial lesions noted as well. No associated restricted diffusion or enhancement to suggest active demyelination. No evidence for acute or subacute infarct. Few small chronic left cerebellar infarcts noted. No evidence for acute or chronic intracranial hemorrhage. No mass lesion, midline shift or mass effect. No hydrocephalus. No extra-axial fluid collection. No other abnormal enhancement. Pituitary gland within normal limits. Midline structures intact. Vascular: Major intracranial vascular flow voids maintained. Skull and upper cervical spine: Craniocervical junction within normal limits. Bone marrow signal intensity normal. Scalp soft tissues within normal limits. Sinuses/Orbits: Globes and orbital soft tissues within normal limits. Paranasal sinuses are clear. No mastoid effusion. Inner ear structures normal. Other: None. MRI CERVICAL SPINE FINDINGS Alignment: Vertebral bodies normally aligned with preservation of the normal cervical lordosis. No listhesis. Vertebrae: Vertebral body heights maintained without evidence for acute or chronic fracture. Bone marrow signal intensity within normal limits. No discrete or worrisome osseous lesions. Minimal reactive endplate changes present about the C5-6 interspace. No other abnormal marrow edema. Cord: Abnormal T2 signal abnormality seen within the central and left aspect of the cord at the level of C4-5 (series 9, image 16, 13). Lesion measures approximately 16 mm in craniocaudad dimension (series 5, image 9). Associated patchy post-contrast enhancement with cord expansion, compatible with active  demyelination (series 12, image 10). No other definite cord signal abnormality identified. No other abnormal enhancement. Posterior Fossa, vertebral arteries, paraspinal tissues: Craniocervical junction normal. Paraspinous and prevertebral soft tissues within normal limits. Normal intravascular flow voids seen within the vertebral arteries bilaterally. Disc levels: C2-C3: Unremarkable. C3-C4:  Mild disc bulge.  No canal or foraminal stenosis. C4-C5: Minimal uncovertebral hypertrophy. No significant canal or foraminal stenosis. C5-C6: Right greater than left uncovertebral hypertrophy. No significant spinal stenosis. Moderate right C6 foraminal narrowing. No significant left foraminal stenosis. C6-C7: Minimal disc bulge with uncovertebral hypertrophy. No significant spinal stenosis. Foramina remain patent. C7-T1:  Unremarkable. Visualized upper thoracic spine within normal limits. MRI LUMBAR SPINE FINDINGS Segmentation: Examination degraded by motion artifact. Normal segmentation. Lowest well-formed disc labeled the L5-S1 level. Alignment: Vertebral bodies normally aligned with preservation of the normal lumbar lordosis. No listhesis. Vertebrae: Vertebral body heights maintained without evidence for acute or chronic fracture. Bone marrow signal intensity within normal limits. No discrete or worrisome osseous lesions. Minimal reactive endplate changes noted about the L1-2 interspace anteriorly. No other abnormal marrow edema or enhancement. Cord: Conus medullaris terminates at the L1 level. Visualized distal cord normal in appearance. Nerve roots of the cauda equina within normal limits. Paraspinal soft tissues: Paraspinous soft tissues within normal limits. Visualized visceral structures unremarkable. Disc levels: L1-2:  Unremarkable. L2-3:  Unremarkable. L3-4: Negative interspace. Mild facet hypertrophy. No significant stenosis or impingement. L4-5: Disc desiccation with associated small central disc protrusion  mildly indents the ventral thecal sac. Mild bilateral facet hypertrophy. Resultant mild canal with bilateral lateral recess narrowing. Foramina remain patent. L5-S1: Disc desiccation. Shallow central disc protrusion mildly indents the ventral thecal sac, closely approximating the descending S1 nerve roots bilaterally. No frank neural impingement or displacement. Minimal facet hypertrophy. Central canal remains widely patent. No foraminal stenosis. IMPRESSION: MRI HEAD IMPRESSION: 1. Advanced cerebral white matter changes involving the supratentorial and infratentorial brain, compatible with history of multiple sclerosis. No evidence for active demyelination. 2. 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 3. Advanced cerebral atrophy for age. MRI CERVICAL SPINE IMPRESSION: 1. Abnormal cord signal abnormality involving the central and left aspect of the cord at the level of C4-5, consistent with history of demyelinating disease/multiple sclerosis. Associated postcontrast enhancement compatible with active demyelination. 2. Uncovertebral disease at C5-6 with resultant moderate right C6 foraminal stenosis. MRI LUMBAR SPINE IMPRESSION: 1. Normal MRI appearance of the distal cord/conus medullaris. 2. Small central disc protrusion with facet hypertrophy at L4-5 with resultant mild canal with bilateral lateral recess stenosis. 3. Small central disc protrusion at L5-S1, closely approximating the descending S1 nerve roots without frank neural impingement. Electronically Signed   By: Rise Mu M.D.   On: 04/24/2018 00:50   Mr Cervical Spine W Wo Contrast  Result Date: 04/24/2018 CLINICAL DATA:  Initial evaluation for acute left-sided weakness, history of multiple sclerosis. EXAM: MRI HEAD WITHOUT AND WITH CONTRAST MRI CERVICAL SPINE WITHOUT AND WITH CONTRAST MRI LUMBAR SPINE WITHOUT AND WITH CONTRAST TECHNIQUE: Multiplanar, multiecho pulse sequences of the brain and surrounding structures, and  cervical spine, to include the craniocervical junction and cervicothoracic junction, as well as the lumbar spine were obtained without and with intravenous contrast. CONTRAST:  10 cc of Gadavist. COMPARISON:  None available. FINDINGS: MRI HEAD FINDINGS Brain: Diffuse prominence of the CSF containing spaces compatible with generalized cerebral atrophy, advanced for age. Asymmetric atrophy noted involving the left midbrain/cerebral peduncle. Multifocal patchy T2/FLAIR hyperintense lesions seen involving the periventricular, deep, and subcortical white matter both cerebral hemispheres, advanced for age. Overall distribution compatible with known history of multiple sclerosis. Few patchy infratentorial lesions noted as well. No associated restricted diffusion or enhancement to suggest active demyelination. No evidence for acute or subacute infarct. Few small chronic left cerebellar infarcts noted. No evidence for acute or chronic intracranial hemorrhage. No mass lesion, midline shift or mass effect. No hydrocephalus. No extra-axial fluid collection. No other abnormal enhancement. Pituitary gland within normal limits. Midline structures intact. Vascular: Major intracranial vascular flow voids maintained. Skull and upper cervical spine: Craniocervical junction within normal limits. Bone marrow signal intensity normal. Scalp soft tissues within normal limits. Sinuses/Orbits: Globes and orbital soft tissues within normal limits. Paranasal sinuses are clear. No mastoid effusion. Inner ear structures normal. Other: None. MRI CERVICAL SPINE FINDINGS Alignment: Vertebral bodies normally aligned with preservation of the normal cervical lordosis. No listhesis. Vertebrae: Vertebral body heights maintained without evidence for acute or chronic fracture. Bone marrow signal intensity within normal limits. No discrete or worrisome osseous lesions. Minimal reactive endplate changes present about the C5-6 interspace. No other abnormal  marrow edema. Cord: Abnormal T2 signal abnormality seen within the central and left aspect of the cord at the level of C4-5 (series 9, image 16, 13). Lesion measures approximately 16 mm in craniocaudad dimension (series 5, image 9). Associated patchy post-contrast enhancement with cord expansion, compatible with active demyelination (series 12, image 10). No other definite cord signal abnormality identified. No other abnormal enhancement. Posterior Fossa, vertebral arteries, paraspinal tissues: Craniocervical junction normal. Paraspinous and prevertebral soft tissues within normal limits. Normal intravascular flow voids seen within the vertebral arteries bilaterally. Disc levels: C2-C3: Unremarkable. C3-C4:  Mild disc bulge.  No canal or foraminal stenosis. C4-C5: Minimal uncovertebral hypertrophy. No significant canal or foraminal stenosis. C5-C6: Right greater than left uncovertebral hypertrophy. No significant spinal stenosis. Moderate right C6 foraminal narrowing. No significant left foraminal stenosis. C6-C7: Minimal disc bulge with uncovertebral hypertrophy. No significant spinal stenosis. Foramina remain patent. C7-T1:  Unremarkable. Visualized  upper thoracic spine within normal limits. MRI LUMBAR SPINE FINDINGS Segmentation: Examination degraded by motion artifact. Normal segmentation. Lowest well-formed disc labeled the L5-S1 level. Alignment: Vertebral bodies normally aligned with preservation of the normal lumbar lordosis. No listhesis. Vertebrae: Vertebral body heights maintained without evidence for acute or chronic fracture. Bone marrow signal intensity within normal limits. No discrete or worrisome osseous lesions. Minimal reactive endplate changes noted about the L1-2 interspace anteriorly. No other abnormal marrow edema or enhancement. Cord: Conus medullaris terminates at the L1 level. Visualized distal cord normal in appearance. Nerve roots of the cauda equina within normal limits. Paraspinal soft  tissues: Paraspinous soft tissues within normal limits. Visualized visceral structures unremarkable. Disc levels: L1-2:  Unremarkable. L2-3:  Unremarkable. L3-4: Negative interspace. Mild facet hypertrophy. No significant stenosis or impingement. L4-5: Disc desiccation with associated small central disc protrusion mildly indents the ventral thecal sac. Mild bilateral facet hypertrophy. Resultant mild canal with bilateral lateral recess narrowing. Foramina remain patent. L5-S1: Disc desiccation. Shallow central disc protrusion mildly indents the ventral thecal sac, closely approximating the descending S1 nerve roots bilaterally. No frank neural impingement or displacement. Minimal facet hypertrophy. Central canal remains widely patent. No foraminal stenosis. IMPRESSION: MRI HEAD IMPRESSION: 1. Advanced cerebral white matter changes involving the supratentorial and infratentorial brain, compatible with history of multiple sclerosis. No evidence for active demyelination. 2. 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 3. Advanced cerebral atrophy for age. MRI CERVICAL SPINE IMPRESSION: 1. Abnormal cord signal abnormality involving the central and left aspect of the cord at the level of C4-5, consistent with history of demyelinating disease/multiple sclerosis. Associated postcontrast enhancement compatible with active demyelination. 2. Uncovertebral disease at C5-6 with resultant moderate right C6 foraminal stenosis. MRI LUMBAR SPINE IMPRESSION: 1. Normal MRI appearance of the distal cord/conus medullaris. 2. Small central disc protrusion with facet hypertrophy at L4-5 with resultant mild canal with bilateral lateral recess stenosis. 3. Small central disc protrusion at L5-S1, closely approximating the descending S1 nerve roots without frank neural impingement. Electronically Signed   By: Rise Mu M.D.   On: 04/24/2018 00:50   Mr Lumbar Spine W Wo Contrast  Result Date: 04/24/2018 CLINICAL  DATA:  Initial evaluation for acute left-sided weakness, history of multiple sclerosis. EXAM: MRI HEAD WITHOUT AND WITH CONTRAST MRI CERVICAL SPINE WITHOUT AND WITH CONTRAST MRI LUMBAR SPINE WITHOUT AND WITH CONTRAST TECHNIQUE: Multiplanar, multiecho pulse sequences of the brain and surrounding structures, and cervical spine, to include the craniocervical junction and cervicothoracic junction, as well as the lumbar spine were obtained without and with intravenous contrast. CONTRAST:  10 cc of Gadavist. COMPARISON:  None available. FINDINGS: MRI HEAD FINDINGS Brain: Diffuse prominence of the CSF containing spaces compatible with generalized cerebral atrophy, advanced for age. Asymmetric atrophy noted involving the left midbrain/cerebral peduncle. Multifocal patchy T2/FLAIR hyperintense lesions seen involving the periventricular, deep, and subcortical white matter both cerebral hemispheres, advanced for age. Overall distribution compatible with known history of multiple sclerosis. Few patchy infratentorial lesions noted as well. No associated restricted diffusion or enhancement to suggest active demyelination. No evidence for acute or subacute infarct. Few small chronic left cerebellar infarcts noted. No evidence for acute or chronic intracranial hemorrhage. No mass lesion, midline shift or mass effect. No hydrocephalus. No extra-axial fluid collection. No other abnormal enhancement. Pituitary gland within normal limits. Midline structures intact. Vascular: Major intracranial vascular flow voids maintained. Skull and upper cervical spine: Craniocervical junction within normal limits. Bone marrow signal intensity normal. Scalp soft tissues within normal  limits. Sinuses/Orbits: Globes and orbital soft tissues within normal limits. Paranasal sinuses are clear. No mastoid effusion. Inner ear structures normal. Other: None. MRI CERVICAL SPINE FINDINGS Alignment: Vertebral bodies normally aligned with preservation of the  normal cervical lordosis. No listhesis. Vertebrae: Vertebral body heights maintained without evidence for acute or chronic fracture. Bone marrow signal intensity within normal limits. No discrete or worrisome osseous lesions. Minimal reactive endplate changes present about the C5-6 interspace. No other abnormal marrow edema. Cord: Abnormal T2 signal abnormality seen within the central and left aspect of the cord at the level of C4-5 (series 9, image 16, 13). Lesion measures approximately 16 mm in craniocaudad dimension (series 5, image 9). Associated patchy post-contrast enhancement with cord expansion, compatible with active demyelination (series 12, image 10). No other definite cord signal abnormality identified. No other abnormal enhancement. Posterior Fossa, vertebral arteries, paraspinal tissues: Craniocervical junction normal. Paraspinous and prevertebral soft tissues within normal limits. Normal intravascular flow voids seen within the vertebral arteries bilaterally. Disc levels: C2-C3: Unremarkable. C3-C4:  Mild disc bulge.  No canal or foraminal stenosis. C4-C5: Minimal uncovertebral hypertrophy. No significant canal or foraminal stenosis. C5-C6: Right greater than left uncovertebral hypertrophy. No significant spinal stenosis. Moderate right C6 foraminal narrowing. No significant left foraminal stenosis. C6-C7: Minimal disc bulge with uncovertebral hypertrophy. No significant spinal stenosis. Foramina remain patent. C7-T1:  Unremarkable. Visualized upper thoracic spine within normal limits. MRI LUMBAR SPINE FINDINGS Segmentation: Examination degraded by motion artifact. Normal segmentation. Lowest well-formed disc labeled the L5-S1 level. Alignment: Vertebral bodies normally aligned with preservation of the normal lumbar lordosis. No listhesis. Vertebrae: Vertebral body heights maintained without evidence for acute or chronic fracture. Bone marrow signal intensity within normal limits. No discrete or  worrisome osseous lesions. Minimal reactive endplate changes noted about the L1-2 interspace anteriorly. No other abnormal marrow edema or enhancement. Cord: Conus medullaris terminates at the L1 level. Visualized distal cord normal in appearance. Nerve roots of the cauda equina within normal limits. Paraspinal soft tissues: Paraspinous soft tissues within normal limits. Visualized visceral structures unremarkable. Disc levels: L1-2:  Unremarkable. L2-3:  Unremarkable. L3-4: Negative interspace. Mild facet hypertrophy. No significant stenosis or impingement. L4-5: Disc desiccation with associated small central disc protrusion mildly indents the ventral thecal sac. Mild bilateral facet hypertrophy. Resultant mild canal with bilateral lateral recess narrowing. Foramina remain patent. L5-S1: Disc desiccation. Shallow central disc protrusion mildly indents the ventral thecal sac, closely approximating the descending S1 nerve roots bilaterally. No frank neural impingement or displacement. Minimal facet hypertrophy. Central canal remains widely patent. No foraminal stenosis. IMPRESSION: MRI HEAD IMPRESSION: 1. Advanced cerebral white matter changes involving the supratentorial and infratentorial brain, compatible with history of multiple sclerosis. No evidence for active demyelination. 2. 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 3. Advanced cerebral atrophy for age. MRI CERVICAL SPINE IMPRESSION: 1. Abnormal cord signal abnormality involving the central and left aspect of the cord at the level of C4-5, consistent with history of demyelinating disease/multiple sclerosis. Associated postcontrast enhancement compatible with active demyelination. 2. Uncovertebral disease at C5-6 with resultant moderate right C6 foraminal stenosis. MRI LUMBAR SPINE IMPRESSION: 1. Normal MRI appearance of the distal cord/conus medullaris. 2. Small central disc protrusion with facet hypertrophy at L4-5 with resultant mild canal  with bilateral lateral recess stenosis. 3. Small central disc protrusion at L5-S1, closely approximating the descending S1 nerve roots without frank neural impingement. Electronically Signed   By: Rise Mu M.D.   On: 04/24/2018 00:50   Dg Fl Guided  Lumbar Puncture  Result Date: 04/24/2018 CLINICAL DATA:  Multiple sclerosis. Patient reports he began experiencing left side numbness x 3 days ago. Reports he was diagnosed with multiple sclerosis 20 years ago and he has previously experienced right sided numbness. EXAM: DIAGNOSTIC LUMBAR PUNCTURE UNDER FLUOROSCOPIC GUIDANCE FLUOROSCOPY TIME:  Fluoroscopy Time:  12 seconds PROCEDURE: Informed consent was obtained from the patient prior to the procedure, including potential complications of headache, allergy, and pain. With the patient prone, the lower back was prepped with Betadine. 1% Lidocaine was used for local anesthesia. Lumbar puncture was performed at the L4-5 level using a 22 gauge needle with return of clear CSF. 8 ml of CSF were obtained for laboratory studies. The patient tolerated the procedure well and there were no apparent complications. Patient was transported back to the patient's room in stable condition. IMPRESSION: Successful fluoroscopic guided diagnostic lumbar puncture. Electronically Signed   By: Elige Ko   On: 04/24/2018 11:03    EKG:   Orders placed or performed in visit on 07/22/07   EKG 12-Lead    ASSESSMENT AND PLAN:   43 year old male with past medical history significant for ongoing smoking, relapsing remitting multiple sclerosis not on any maintenance medications presents to hospital secondary to worsening left-sided weakness.  1.  Multiple sclerosis exacerbation-MRI of the brain and also C-spine indicating active demyelination and advanced white matter changes consistent with demyelinating disease. -No evidence of any stroke. -Appreciate neurology consult.  Lumbar puncture done.  No infection  identified -Continue IV Solu-Medrol for now. -Outpatient follow-up with neurology needed to start on disease modifying agents for MS  2.  Low B12 levels-being replaced orally  3.  Depression-on Wellbutrin  4.  Tobacco use disorder-continue nicotine patch  5.  DVT prophylaxis-we will start subcutaneous heparin  PT recommending CIR at this time   All the records are reviewed and case discussed with Care Management/Social Workerr. Management plans discussed with the patient, family and they are in agreement.  CODE STATUS: Full code  TOTAL TIME TAKING CARE OF THIS PATIENT: 38 minutes.   POSSIBLE D/C IN 2 DAYS, DEPENDING ON CLINICAL CONDITION.   Enid Baas M.D on 04/25/2018 at 11:49 AM  Between 7am to 6pm - Pager - [phone]  After 6pm go to [email or web address] - password [employer]  [facility]  Office  [phone]  CC: Primary care physician; Patient, No Pcp Per

## 2018-04-25 NOTE — Evaluation (Signed)
Occupational Therapy Evaluation Patient Details Name: Evan Reynolds. MRN: [id number] DOB: [birth year] Today's Date: 04/25/2018    History of Present Illness Pt is a 43 y.o. male presenting to hospital 04/23/18 with increasing UE/LE weakness resulting in fall (L>R weakness).  PMH includes relapsing remitting MS.   Clinical Impression   Pt admitted with above diagnoses with L sided weakness and MS exacerbation limiting ability to engage in BADL at desired level of ind. PTA pt reports living ind with no overt modifications to daily living. Upon evaluation, pt very motivated and eager to work with therapy, very adamant that he will not resort to using a w/c. Pt has good support from girlfriend whom he lives with and friends. Pt presenting with significant LUE/LLE weakness and diminished sensation per testing at [facility]. Pt compensating with trapezius to lift arm, educated on SROM stretching to help maintain shoulder mobility. Min A needed for bed mobility at this time to assist with LLE, educated pt on compensatory technique of leg hooking. Pt completed toilet t/f with regular toilet at mod A level and grab bar, min A for functional mobility with RW. Issued pt theraputty to help regain strength in LUE between sessions. At this time highly recommend pt d/c to CIR for continued intensive neuro rehab to return to safely to ind PLOF. Will continue to follow acutely per OT POC.    Follow Up Recommendations  [facility]    Equipment Recommendations  Other (comment)(TBD)    Recommendations for Other Services       Precautions / Restrictions Precautions Precautions: Fall Precaution Comments: L sided weakness Restrictions Weight Bearing Restrictions: No      Mobility Bed Mobility Overal bed mobility: Needs Assistance Bed Mobility: Supine to Sit     Supine to sit: Min assist;HOB elevated     General bed mobility comments: to guide LLE, increased time  Transfers Overall transfer level: Needs  assistance Equipment used: Rolling walker (2 wheeled) Transfers: Sit to/from Stand Sit to Stand: Mod assist         General transfer comment: VC's for hand placement, needing mod A to power up to standing x2 attempts    Balance Overall balance assessment: Needs assistance Sitting-balance support: No upper extremity supported Sitting balance-Leahy Scale: Good Sitting balance - Comments: able to dynamically reach   Standing balance support: Bilateral upper extremity supported;During functional activity Standing balance-Leahy Scale: Poor Standing balance comment: reliant on external support                           ADL either performed or assessed with clinical judgement   ADL Overall ADL's : Needs assistance/impaired Eating/Feeding: Set up;Sitting Eating/Feeding Details (indicate cue type and reason): set up for package opening due to decreased LUE Grooming: Minimal assistance;Sitting Grooming Details (indicate cue type and reason): to open toothpaste Upper Body Bathing: Minimal assistance;Sitting;Cueing for compensatory techniques   Lower Body Bathing: Maximal assistance;Sit to/from stand;Sitting/lateral leans;Cueing for compensatory techniques   Upper Body Dressing : Minimal assistance;Sitting;Cueing for compensatory techniques   Lower Body Dressing: Maximal assistance;Sit to/from stand;Sitting/lateral leans;Cueing for compensatory techniques   Toilet Transfer: Moderate assistance;Regular Toilet;Grab bars;RW Statistician Details (indicate cue type and reason): mod A sit > stand from regular height toilet, heavy use of grab bar and needing support on L side Toileting- Clothing Manipulation and Hygiene: Minimal assistance;Sitting/lateral lean;Sit to/from stand   Tub/ Engineer, structural: Moderate assistance;Shower seat;Tub bench;Rolling walker   Functional mobility during ADLs: Minimal  assistance;Rolling walker General ADL Comments: pt with increased L sided  weakness/ decreased sensation; requires support on L side due to knee buckling/extension     Vision Patient Visual Report: No change from baseline       Perception     Praxis      Pertinent Vitals/Pain Pain Assessment: No/denies pain     Hand Dominance     Extremity/Trunk Assessment Upper Extremity Assessment Upper Extremity Assessment: LUE deficits/detail;RUE deficits/detail RUE Deficits / Details: WFL grip strengths, 4+/5 globally LUE Deficits / Details: decrased sensation with light and moving touch on entire LUE, poor grip strength, 3+/5 shoulder and elbow LUE Sensation: decreased light touch LUE Coordination: decreased fine motor;decreased gross motor   Lower Extremity Assessment Lower Extremity Assessment: Defer to PT evaluation       Communication Communication Communication: No difficulties   Cognition Arousal/Alertness: Awake/alert Behavior During Therapy: WFL for tasks assessed/performed Overall Cognitive Status: Within Functional Limits for tasks assessed                                     General Comments       Exercises Exercises: General Lower Extremity General Exercises - Lower Extremity Long Arc Quad: Both;10 reps Heel Slides: Both;10 reps Hip ABduction/ADduction: Both;10 reps Hip Flexion/Marching: Both;10 reps Heel Raises: Both;10 reps   Shoulder Instructions      Home Living Family/patient expects to be discharged to:: Private residence Living Arrangements: Spouse/significant other(gf) Available Help at Discharge: Friend(s) Type of Home: Apartment Home Access: Stairs to enter [employer] of Steps: 1 step down with L railing down   Home Layout: One level     Bathroom Shower/Tub: Producer, television/film/video: Standard     Home Equipment: None          Prior Functioning/Environment Level of Independence: Independent        Comments: on disability, does not drive, but otherwise completes all  BADL ind        OT Problem List: Decreased strength;Decreased activity tolerance;Decreased knowledge of use of DME or AE;Impaired UE functional use;Decreased range of motion;Impaired balance (sitting and/or standing);Decreased coordination;Impaired sensation      OT Treatment/Interventions: Self-care/ADL training;DME and/or AE instruction;Therapeutic activities;Balance training;Therapeutic exercise;Neuromuscular education;Patient/family education;Energy conservation    OT Goals(Current goals can be found in the care plan section) Acute Rehab OT Goals Patient Stated Goal: to not have to use a wheelchair and keep ind OT Goal Formulation: With patient Time For Goal Achievement: 05/09/18 Potential to Achieve Goals: Good  OT Frequency: Min 3X/week   Barriers to D/C:            Co-evaluation              AM-PAC OT "6 Clicks" Daily Activity     Outcome Measure Help from another person eating meals?: A Little Help from another person taking care of personal grooming?: A Little Help from another person toileting, which includes using toliet, bedpan, or urinal?: A Lot Help from another person bathing (including washing, rinsing, drying)?: A Lot Help from another person to put on and taking off regular upper body clothing?: A Little Help from another person to put on and taking off regular lower body clothing?: A Lot 6 Click Score: 15   End of Session Equipment Utilized During Treatment: Gait belt;Rolling walker  Activity Tolerance: Patient tolerated treatment well Patient left: in bed;with call bell/phone within reach;with  family/visitor present  OT Visit Diagnosis: Unsteadiness on feet (R26.81);Other abnormalities of gait and mobility (R26.89);Muscle weakness (generalized) (M62.81);Other symptoms and signs involving the nervous system (R29.898)                Time: 4098-1191 OT Time Calculation (min): 26 min Charges:  OT General Charges $OT Visit: 1 Visit OT Evaluation $OT  Eval Moderate Complexity: 1 Mod OT Treatments $Self Care/Home Management : 8-22 mins  Dalphine Handing, MSOT, OTR/L Behavioral Health OT/ Acute Relief OT  Dalphine Handing 04/25/2018, 4:04 PM

## 2018-04-25 NOTE — Progress Notes (Signed)
Subjective: Still complaining of L sided weakness  Objective: Current vital signs: BP 120/77 (BP Location: Right Arm)   Pulse 82   Temp (!) 97.4 F (36.3 C) (Oral)   Resp 19   Ht '6\' 1"'  (1.854 m)   Wt 111.1 kg   SpO2 97%   BMI 32.32 kg/m  Vital signs in last 24 hours: Temp:  [97.4 F (36.3 C)-98 F (36.7 C)] 97.4 F (36.3 C) (03/21 0741) Pulse Rate:  [73-84] 82 (03/21 0741) Resp:  [17-19] 19 (03/21 0408) BP: (120-128)/(77-83) 120/77 (03/21 0741) SpO2:  [94 %-97 %] 97 % (03/21 0741)  Intake/Output from previous day: 03/20 0701 - 03/21 0700 In: 51.9 [IV Piggyback:51.9] Out: 750 [Urine:750] Intake/Output this shift: Total I/O In: -  Out: 200 [Urine:200] Nutritional status:  Diet Order            Diet regular Room service appropriate? Yes; Fluid consistency: Thin  Diet effective now             Neurological Exam  Mental Status: Alert, oriented, thought content appropriate. Speech fluent without evidence of aphasia. Able to follow 3 step commands without difficulty. Attention span and concentration seemed appropriate  Cranial Nerves: II: Discs flat bilaterally; Visual fields grossly normal, pupils equal, round, reactive to light and accommodation III,IV, VI: ptosis not present, extra-ocular motions intact bilaterally V,VII: smile symmetric, facial light touch sensationintact VIII: hearing normal bilaterally IX,X: gag reflex present XI: bilateral shoulder shrug XII: midline tongue extension Motor: Right :Upper extremity 5/5Without pronator driftLeft: Upper extremity 4/5 without pronator drift Right:Lower extremity 4/5Left: Lower extremity 4-/5 Large amplitude tremors noted on the right upper extremity Left foot droop Tone and bulk:normal tone throughout; no atrophy noted Sensory: Pinprick and light touchdecreased on the left Deep Tendon Reflexes: 3+ (hypereflexia) and symmetric  throughout Plantars: Right:muteLeft: mute Cerebellar: Finger-to-nosetesting dysmetric on the left. Unable to perform heel to shin testing on the LLE Gait: not tested due to safety concerns  Data Reviewed  Lab Results: Basic Metabolic Panel: Recent Labs  Lab 04/23/18 1240 04/24/18 0451  NA 140 143  K 4.3 3.9  CL 107 111  CO2 27 23  GLUCOSE 82 98  BUN 13 15  CREATININE 0.86 0.89  CALCIUM 9.1 8.9    Liver Function Tests: Recent Labs  Lab 04/23/18 1240  AST 19  ALT 17  ALKPHOS 45  BILITOT 0.7  PROT 7.3  ALBUMIN 4.1   No results for input(s): LIPASE, AMYLASE in the last 168 hours. No results for input(s): AMMONIA in the last 168 hours.  CBC: Recent Labs  Lab 04/23/18 1240 04/24/18 0451  WBC 7.7 11.1*  NEUTROABS 4.4  --   HGB 16.5 16.3  HCT 48.8 48.8  MCV 92.8 93.5  PLT 329 327    Cardiac Enzymes: No results for input(s): CKTOTAL, CKMB, CKMBINDEX, TROPONINI in the last 168 hours.  Lipid Panel: No results for input(s): CHOL, TRIG, HDL, CHOLHDL, VLDL, LDLCALC in the last 168 hours.  CBG: No results for input(s): GLUCAP in the last 168 hours.  Microbiology: No results found for this or any previous visit.  Coagulation Studies: No results for input(s): LABPROT, INR in the last 72 hours.  Imaging: Mr Jeri Cos YJ Contrast  Result Date: 04/24/2018 CLINICAL DATA:  Initial evaluation for acute left-sided weakness, history of multiple sclerosis. EXAM: MRI HEAD WITHOUT AND WITH CONTRAST MRI CERVICAL SPINE WITHOUT AND WITH CONTRAST MRI LUMBAR SPINE WITHOUT AND WITH CONTRAST TECHNIQUE: Multiplanar, multiecho pulse sequences of  the brain and surrounding structures, and cervical spine, to include the craniocervical junction and cervicothoracic junction, as well as the lumbar spine were obtained without and with intravenous contrast. CONTRAST:  10 cc of Gadavist. COMPARISON:  None available. FINDINGS: MRI HEAD FINDINGS Brain: Diffuse  prominence of the CSF containing spaces compatible with generalized cerebral atrophy, advanced for age. Asymmetric atrophy noted involving the left midbrain/cerebral peduncle. Multifocal patchy T2/FLAIR hyperintense lesions seen involving the periventricular, deep, and subcortical white matter both cerebral hemispheres, advanced for age. Overall distribution compatible with known history of multiple sclerosis. Few patchy infratentorial lesions noted as well. No associated restricted diffusion or enhancement to suggest active demyelination. No evidence for acute or subacute infarct. Few small chronic left cerebellar infarcts noted. No evidence for acute or chronic intracranial hemorrhage. No mass lesion, midline shift or mass effect. No hydrocephalus. No extra-axial fluid collection. No other abnormal enhancement. Pituitary gland within normal limits. Midline structures intact. Vascular: Major intracranial vascular flow voids maintained. Skull and upper cervical spine: Craniocervical junction within normal limits. Bone marrow signal intensity normal. Scalp soft tissues within normal limits. Sinuses/Orbits: Globes and orbital soft tissues within normal limits. Paranasal sinuses are clear. No mastoid effusion. Inner ear structures normal. Other: None. MRI CERVICAL SPINE FINDINGS Alignment: Vertebral bodies normally aligned with preservation of the normal cervical lordosis. No listhesis. Vertebrae: Vertebral body heights maintained without evidence for acute or chronic fracture. Bone marrow signal intensity within normal limits. No discrete or worrisome osseous lesions. Minimal reactive endplate changes present about the C5-6 interspace. No other abnormal marrow edema. Cord: Abnormal T2 signal abnormality seen within the central and left aspect of the cord at the level of C4-5 (series 9, image 16, 13). Lesion measures approximately 16 mm in craniocaudad dimension (series 5, image 9). Associated patchy post-contrast  enhancement with cord expansion, compatible with active demyelination (series 12, image 10). No other definite cord signal abnormality identified. No other abnormal enhancement. Posterior Fossa, vertebral arteries, paraspinal tissues: Craniocervical junction normal. Paraspinous and prevertebral soft tissues within normal limits. Normal intravascular flow voids seen within the vertebral arteries bilaterally. Disc levels: C2-C3: Unremarkable. C3-C4:  Mild disc bulge.  No canal or foraminal stenosis. C4-C5: Minimal uncovertebral hypertrophy. No significant canal or foraminal stenosis. C5-C6: Right greater than left uncovertebral hypertrophy. No significant spinal stenosis. Moderate right C6 foraminal narrowing. No significant left foraminal stenosis. C6-C7: Minimal disc bulge with uncovertebral hypertrophy. No significant spinal stenosis. Foramina remain patent. C7-T1:  Unremarkable. Visualized upper thoracic spine within normal limits. MRI LUMBAR SPINE FINDINGS Segmentation: Examination degraded by motion artifact. Normal segmentation. Lowest well-formed disc labeled the L5-S1 level. Alignment: Vertebral bodies normally aligned with preservation of the normal lumbar lordosis. No listhesis. Vertebrae: Vertebral body heights maintained without evidence for acute or chronic fracture. Bone marrow signal intensity within normal limits. No discrete or worrisome osseous lesions. Minimal reactive endplate changes noted about the L1-2 interspace anteriorly. No other abnormal marrow edema or enhancement. Cord: Conus medullaris terminates at the L1 level. Visualized distal cord normal in appearance. Nerve roots of the cauda equina within normal limits. Paraspinal soft tissues: Paraspinous soft tissues within normal limits. Visualized visceral structures unremarkable. Disc levels: L1-2:  Unremarkable. L2-3:  Unremarkable. L3-4: Negative interspace. Mild facet hypertrophy. No significant stenosis or impingement. L4-5: Disc  desiccation with associated small central disc protrusion mildly indents the ventral thecal sac. Mild bilateral facet hypertrophy. Resultant mild canal with bilateral lateral recess narrowing. Foramina remain patent. L5-S1: Disc desiccation. Shallow central disc protrusion mildly indents  the ventral thecal sac, closely approximating the descending S1 nerve roots bilaterally. No frank neural impingement or displacement. Minimal facet hypertrophy. Central canal remains widely patent. No foraminal stenosis. IMPRESSION: MRI HEAD IMPRESSION: 1. Advanced cerebral white matter changes involving the supratentorial and infratentorial brain, compatible with history of multiple sclerosis. No evidence for active demyelination. 2. 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 3. Advanced cerebral atrophy for age. MRI CERVICAL SPINE IMPRESSION: 1. Abnormal cord signal abnormality involving the central and left aspect of the cord at the level of C4-5, consistent with history of demyelinating disease/multiple sclerosis. Associated postcontrast enhancement compatible with active demyelination. 2. Uncovertebral disease at C5-6 with resultant moderate right C6 foraminal stenosis. MRI LUMBAR SPINE IMPRESSION: 1. Normal MRI appearance of the distal cord/conus medullaris. 2. Small central disc protrusion with facet hypertrophy at L4-5 with resultant mild canal with bilateral lateral recess stenosis. 3. Small central disc protrusion at L5-S1, closely approximating the descending S1 nerve roots without frank neural impingement. Electronically Signed   By: Jeannine Boga M.D.   On: 04/24/2018 00:50   Mr Cervical Spine W Wo Contrast  Result Date: 04/24/2018 CLINICAL DATA:  Initial evaluation for acute left-sided weakness, history of multiple sclerosis. EXAM: MRI HEAD WITHOUT AND WITH CONTRAST MRI CERVICAL SPINE WITHOUT AND WITH CONTRAST MRI LUMBAR SPINE WITHOUT AND WITH CONTRAST TECHNIQUE: Multiplanar, multiecho pulse  sequences of the brain and surrounding structures, and cervical spine, to include the craniocervical junction and cervicothoracic junction, as well as the lumbar spine were obtained without and with intravenous contrast. CONTRAST:  10 cc of Gadavist. COMPARISON:  None available. FINDINGS: MRI HEAD FINDINGS Brain: Diffuse prominence of the CSF containing spaces compatible with generalized cerebral atrophy, advanced for age. Asymmetric atrophy noted involving the left midbrain/cerebral peduncle. Multifocal patchy T2/FLAIR hyperintense lesions seen involving the periventricular, deep, and subcortical white matter both cerebral hemispheres, advanced for age. Overall distribution compatible with known history of multiple sclerosis. Few patchy infratentorial lesions noted as well. No associated restricted diffusion or enhancement to suggest active demyelination. No evidence for acute or subacute infarct. Few small chronic left cerebellar infarcts noted. No evidence for acute or chronic intracranial hemorrhage. No mass lesion, midline shift or mass effect. No hydrocephalus. No extra-axial fluid collection. No other abnormal enhancement. Pituitary gland within normal limits. Midline structures intact. Vascular: Major intracranial vascular flow voids maintained. Skull and upper cervical spine: Craniocervical junction within normal limits. Bone marrow signal intensity normal. Scalp soft tissues within normal limits. Sinuses/Orbits: Globes and orbital soft tissues within normal limits. Paranasal sinuses are clear. No mastoid effusion. Inner ear structures normal. Other: None. MRI CERVICAL SPINE FINDINGS Alignment: Vertebral bodies normally aligned with preservation of the normal cervical lordosis. No listhesis. Vertebrae: Vertebral body heights maintained without evidence for acute or chronic fracture. Bone marrow signal intensity within normal limits. No discrete or worrisome osseous lesions. Minimal reactive endplate changes  present about the C5-6 interspace. No other abnormal marrow edema. Cord: Abnormal T2 signal abnormality seen within the central and left aspect of the cord at the level of C4-5 (series 9, image 16, 13). Lesion measures approximately 16 mm in craniocaudad dimension (series 5, image 9). Associated patchy post-contrast enhancement with cord expansion, compatible with active demyelination (series 12, image 10). No other definite cord signal abnormality identified. No other abnormal enhancement. Posterior Fossa, vertebral arteries, paraspinal tissues: Craniocervical junction normal. Paraspinous and prevertebral soft tissues within normal limits. Normal intravascular flow voids seen within the vertebral arteries bilaterally. Disc levels: C2-C3: Unremarkable. C3-C4:  Mild disc bulge.  No canal or foraminal stenosis. C4-C5: Minimal uncovertebral hypertrophy. No significant canal or foraminal stenosis. C5-C6: Right greater than left uncovertebral hypertrophy. No significant spinal stenosis. Moderate right C6 foraminal narrowing. No significant left foraminal stenosis. C6-C7: Minimal disc bulge with uncovertebral hypertrophy. No significant spinal stenosis. Foramina remain patent. C7-T1:  Unremarkable. Visualized upper thoracic spine within normal limits. MRI LUMBAR SPINE FINDINGS Segmentation: Examination degraded by motion artifact. Normal segmentation. Lowest well-formed disc labeled the L5-S1 level. Alignment: Vertebral bodies normally aligned with preservation of the normal lumbar lordosis. No listhesis. Vertebrae: Vertebral body heights maintained without evidence for acute or chronic fracture. Bone marrow signal intensity within normal limits. No discrete or worrisome osseous lesions. Minimal reactive endplate changes noted about the L1-2 interspace anteriorly. No other abnormal marrow edema or enhancement. Cord: Conus medullaris terminates at the L1 level. Visualized distal cord normal in appearance. Nerve roots of  the cauda equina within normal limits. Paraspinal soft tissues: Paraspinous soft tissues within normal limits. Visualized visceral structures unremarkable. Disc levels: L1-2:  Unremarkable. L2-3:  Unremarkable. L3-4: Negative interspace. Mild facet hypertrophy. No significant stenosis or impingement. L4-5: Disc desiccation with associated small central disc protrusion mildly indents the ventral thecal sac. Mild bilateral facet hypertrophy. Resultant mild canal with bilateral lateral recess narrowing. Foramina remain patent. L5-S1: Disc desiccation. Shallow central disc protrusion mildly indents the ventral thecal sac, closely approximating the descending S1 nerve roots bilaterally. No frank neural impingement or displacement. Minimal facet hypertrophy. Central canal remains widely patent. No foraminal stenosis. IMPRESSION: MRI HEAD IMPRESSION: 1. Advanced cerebral white matter changes involving the supratentorial and infratentorial brain, compatible with history of multiple sclerosis. No evidence for active demyelination. 2. 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 3. Advanced cerebral atrophy for age. MRI CERVICAL SPINE IMPRESSION: 1. Abnormal cord signal abnormality involving the central and left aspect of the cord at the level of C4-5, consistent with history of demyelinating disease/multiple sclerosis. Associated postcontrast enhancement compatible with active demyelination. 2. Uncovertebral disease at C5-6 with resultant moderate right C6 foraminal stenosis. MRI LUMBAR SPINE IMPRESSION: 1. Normal MRI appearance of the distal cord/conus medullaris. 2. Small central disc protrusion with facet hypertrophy at L4-5 with resultant mild canal with bilateral lateral recess stenosis. 3. Small central disc protrusion at L5-S1, closely approximating the descending S1 nerve roots without frank neural impingement. Electronically Signed   By: Jeannine Boga M.D.   On: 04/24/2018 00:50   Mr Lumbar  Spine W Wo Contrast  Result Date: 04/24/2018 CLINICAL DATA:  Initial evaluation for acute left-sided weakness, history of multiple sclerosis. EXAM: MRI HEAD WITHOUT AND WITH CONTRAST MRI CERVICAL SPINE WITHOUT AND WITH CONTRAST MRI LUMBAR SPINE WITHOUT AND WITH CONTRAST TECHNIQUE: Multiplanar, multiecho pulse sequences of the brain and surrounding structures, and cervical spine, to include the craniocervical junction and cervicothoracic junction, as well as the lumbar spine were obtained without and with intravenous contrast. CONTRAST:  10 cc of Gadavist. COMPARISON:  None available. FINDINGS: MRI HEAD FINDINGS Brain: Diffuse prominence of the CSF containing spaces compatible with generalized cerebral atrophy, advanced for age. Asymmetric atrophy noted involving the left midbrain/cerebral peduncle. Multifocal patchy T2/FLAIR hyperintense lesions seen involving the periventricular, deep, and subcortical white matter both cerebral hemispheres, advanced for age. Overall distribution compatible with known history of multiple sclerosis. Few patchy infratentorial lesions noted as well. No associated restricted diffusion or enhancement to suggest active demyelination. No evidence for acute or subacute infarct. Few small chronic left cerebellar infarcts noted. No evidence for  acute or chronic intracranial hemorrhage. No mass lesion, midline shift or mass effect. No hydrocephalus. No extra-axial fluid collection. No other abnormal enhancement. Pituitary gland within normal limits. Midline structures intact. Vascular: Major intracranial vascular flow voids maintained. Skull and upper cervical spine: Craniocervical junction within normal limits. Bone marrow signal intensity normal. Scalp soft tissues within normal limits. Sinuses/Orbits: Globes and orbital soft tissues within normal limits. Paranasal sinuses are clear. No mastoid effusion. Inner ear structures normal. Other: None. MRI CERVICAL SPINE FINDINGS Alignment:  Vertebral bodies normally aligned with preservation of the normal cervical lordosis. No listhesis. Vertebrae: Vertebral body heights maintained without evidence for acute or chronic fracture. Bone marrow signal intensity within normal limits. No discrete or worrisome osseous lesions. Minimal reactive endplate changes present about the C5-6 interspace. No other abnormal marrow edema. Cord: Abnormal T2 signal abnormality seen within the central and left aspect of the cord at the level of C4-5 (series 9, image 16, 13). Lesion measures approximately 16 mm in craniocaudad dimension (series 5, image 9). Associated patchy post-contrast enhancement with cord expansion, compatible with active demyelination (series 12, image 10). No other definite cord signal abnormality identified. No other abnormal enhancement. Posterior Fossa, vertebral arteries, paraspinal tissues: Craniocervical junction normal. Paraspinous and prevertebral soft tissues within normal limits. Normal intravascular flow voids seen within the vertebral arteries bilaterally. Disc levels: C2-C3: Unremarkable. C3-C4:  Mild disc bulge.  No canal or foraminal stenosis. C4-C5: Minimal uncovertebral hypertrophy. No significant canal or foraminal stenosis. C5-C6: Right greater than left uncovertebral hypertrophy. No significant spinal stenosis. Moderate right C6 foraminal narrowing. No significant left foraminal stenosis. C6-C7: Minimal disc bulge with uncovertebral hypertrophy. No significant spinal stenosis. Foramina remain patent. C7-T1:  Unremarkable. Visualized upper thoracic spine within normal limits. MRI LUMBAR SPINE FINDINGS Segmentation: Examination degraded by motion artifact. Normal segmentation. Lowest well-formed disc labeled the L5-S1 level. Alignment: Vertebral bodies normally aligned with preservation of the normal lumbar lordosis. No listhesis. Vertebrae: Vertebral body heights maintained without evidence for acute or chronic fracture. Bone marrow  signal intensity within normal limits. No discrete or worrisome osseous lesions. Minimal reactive endplate changes noted about the L1-2 interspace anteriorly. No other abnormal marrow edema or enhancement. Cord: Conus medullaris terminates at the L1 level. Visualized distal cord normal in appearance. Nerve roots of the cauda equina within normal limits. Paraspinal soft tissues: Paraspinous soft tissues within normal limits. Visualized visceral structures unremarkable. Disc levels: L1-2:  Unremarkable. L2-3:  Unremarkable. L3-4: Negative interspace. Mild facet hypertrophy. No significant stenosis or impingement. L4-5: Disc desiccation with associated small central disc protrusion mildly indents the ventral thecal sac. Mild bilateral facet hypertrophy. Resultant mild canal with bilateral lateral recess narrowing. Foramina remain patent. L5-S1: Disc desiccation. Shallow central disc protrusion mildly indents the ventral thecal sac, closely approximating the descending S1 nerve roots bilaterally. No frank neural impingement or displacement. Minimal facet hypertrophy. Central canal remains widely patent. No foraminal stenosis. IMPRESSION: MRI HEAD IMPRESSION: 1. Advanced cerebral white matter changes involving the supratentorial and infratentorial brain, compatible with history of multiple sclerosis. No evidence for active demyelination. 2. 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 3. Advanced cerebral atrophy for age. MRI CERVICAL SPINE IMPRESSION: 1. Abnormal cord signal abnormality involving the central and left aspect of the cord at the level of C4-5, consistent with history of demyelinating disease/multiple sclerosis. Associated postcontrast enhancement compatible with active demyelination. 2. Uncovertebral disease at C5-6 with resultant moderate right C6 foraminal stenosis. MRI LUMBAR SPINE IMPRESSION: 1. Normal MRI appearance of the distal cord/conus  medullaris. 2. Small central disc protrusion with  facet hypertrophy at L4-5 with resultant mild canal with bilateral lateral recess stenosis. 3. Small central disc protrusion at L5-S1, closely approximating the descending S1 nerve roots without frank neural impingement. Electronically Signed   By: Jeannine Boga M.D.   On: 04/24/2018 00:50   Dg Fl Guided Lumbar Puncture  Result Date: 04/24/2018 CLINICAL DATA:  Multiple sclerosis. Patient reports he began experiencing left side numbness x 3 days ago. Reports he was diagnosed with multiple sclerosis 20 years ago and he has previously experienced right sided numbness. EXAM: DIAGNOSTIC LUMBAR PUNCTURE UNDER FLUOROSCOPIC GUIDANCE FLUOROSCOPY TIME:  Fluoroscopy Time:  12 seconds PROCEDURE: Informed consent was obtained from the patient prior to the procedure, including potential complications of headache, allergy, and pain. With the patient prone, the lower back was prepped with Betadine. 1% Lidocaine was used for local anesthesia. Lumbar puncture was performed at the L4-5 level using a 22 gauge needle with return of clear CSF. 8 ml of CSF were obtained for laboratory studies. The patient tolerated the procedure well and there were no apparent complications. Patient was transported back to the patient's room in stable condition. IMPRESSION: Successful fluoroscopic guided diagnostic lumbar puncture. Electronically Signed   By: Kathreen Devoid   On: 04/24/2018 11:03    Medications:  I have reviewed the patient's current medications. Prior to Admission:  Medications Prior to Admission  Medication Sig Dispense Refill Last Dose  . buPROPion (WELLBUTRIN XL) 150 MG 24 hr tablet Take 150 mg by mouth every morning.      Scheduled: . buPROPion  150 mg Oral [id number]  . cholecalciferol  1,000 Units Oral Daily  . cyanocobalamin  1,000 mcg Intramuscular Once  . nicotine  21 mg Transdermal Daily  . vitamin B-12  1,000 mcg Oral Daily   Assessment: 43 y.o with past medical history of tobacco use, relapsing remitting  multiple sclerosis currently not on disease modifying drugs presenting to the ED on 04/23/2018 with chief complaints of worsening upper and lower extremity weakness resulting in a fall.  Likely MS exacerbation possible progression. Patient with remote history of MS, unclear when the diagnosis was made. He report that he was on Rebiff trial at [facility] but was taken off the medication due to ineffectiveness. MRI brain reviewed and shows advanced cerebral white matter changes involving the supratentorial and infratentorial brain consistent with demyelinating disease, no evidence of active lesions noted.  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  MRI cervical spine reviewed and shows abnormal cord signal involving the central and left aspect of the cord at the level of C4-5, postcontrast enhancement compatible with active demyelination.  No acute abnormality noted on MRI lumbar spine.  Labs reviewed and shows low vitamin B12, normal folate, ANA, ESR, RF, HIV and RPR.  Pending further work-up.  Plan: 1. Start Solu-Medrol day 2 of 3 2. Vitamin B12 maintenance dose of 1000 mcg oral daily 3. LP : CSF oligoclonal bands, IgG index pending  4.  Vitamin D3 1000 units oral daily 5. PT/OT to evaluate 6.  Smoking cessation counseling 7.  JC virus pending, patient will need to follow-up with outpatient neurology for consideration of disease modifying medication for MS.

## 2018-04-26 LAB — BASIC METABOLIC PANEL
ANION GAP: 7 (ref 5–15)
BUN: 22 mg/dL — ABNORMAL HIGH (ref 6–20)
CO2: 26 mmol/L (ref 22–32)
Calcium: 8.9 mg/dL (ref 8.9–10.3)
Chloride: 107 mmol/L (ref 98–111)
Creatinine, Ser: 0.89 mg/dL (ref 0.61–1.24)
GFR calc Af Amer: >60 mL/min (ref >60)
GFR calc non Af Amer: >60 mL/min (ref >60)
Glucose, Bld: 125 mg/dL — ABNORMAL HIGH (ref 70–99)
POTASSIUM: 4.5 mmol/L (ref 3.5–5.1)
Sodium: 140 mmol/L (ref 135–145)

## 2018-04-26 MED ORDER — PANTOPRAZOLE SODIUM 40 MG PO TBEC
40.0000 mg | DELAYED_RELEASE_TABLET | Freq: Two times a day (BID) | ORAL | Status: DC
  Administered 2018-04-26 – 2018-04-28 (×4): 40 mg via ORAL
  Filled 2018-04-26 (×4): qty 1

## 2018-04-26 NOTE — Progress Notes (Signed)
Physical Therapy Treatment Patient Details Name: Evan Reynolds. MRN: [id number] DOB: 11/15/1975 Today's Date: 04/26/2018    History of Present Illness Pt is a 43 y.o. male presenting to hospital 04/23/18 with increasing UE/LE weakness resulting in fall (L>R weakness).  PMH includes relapsing remitting MS.    PT Comments    Patient is able to increase ambulation this session (69ft) with better Lfoot clearance d/t compensation of hip and knee flexion. PT continued to encourage patient to attempt to DF through swing phase and gave heavy TC for equal wt shift with ambulation, which patient is able to somewhat comply with. Patient continues to need PT for safety as demonstrated by near fall with turning walker to leave room, to continue increasing gait distance, and improve gait quality to reduce fall risk and return to PLOF. Patient able to complete therex with accuracy following PT cuing. PT recommendation remains CIR for continued services.   Follow Up Recommendations  CIR     Equipment Recommendations  Rolling walker with 5" wheels    Recommendations for Other Services Rehab consult     Precautions / Restrictions Precautions Precautions: Fall Precaution Comments: L sided weakness Restrictions Weight Bearing Restrictions: No    Mobility  Bed Mobility Overal bed mobility: Modified Independent Bed Mobility: Supine to Sit;Sit to Supine     Supine to sit: HOB elevated;Supervision Sit to supine: Min assist   General bed mobility comments: min assist for LLE   Transfers Overall transfer level: Needs assistance Equipment used: Rolling walker (2 wheeled) Transfers: Sit to/from Stand Sit to Stand: Min guard         General transfer comment: Good carry over of proper safety technique with STS, able to complete transfer with help from bed at back of LE to help stabilkize once  standing  Ambulation/Gait Ambulation/Gait assistance: Min assist;Max assist Gait Distance (Feet):  75 Feet Assistive device: Rolling walker (2 wheeled)   Gait velocity: Below community ambulation speed   General Gait Details: With turning to head out of room patient had a LOB and required maxA to remain standing. Otherwise, minA over 7feet with TC for wt shift to L side, VC to increase BOS (patient demonstrates scissor gait), and better foot clearance d/t excessive knee and hip flexion   Stairs             Wheelchair Mobility    Modified Rankin (Stroke Patients Only)       Balance Overall balance assessment: Needs assistance Sitting-balance support: No upper extremity supported Sitting balance-Leahy Scale: Good Sitting balance - Comments: able to dynamically reach   Standing balance support: Bilateral upper extremity supported;During functional activity Standing balance-Leahy Scale: Poor Standing balance comment: reliant on external support                            Cognition Arousal/Alertness: Awake/alert Behavior During Therapy: WFL for tasks assessed/performed Overall Cognitive Status: Within Functional Limits for tasks assessed                                        Exercises General Exercises - Lower Extremity Long Arc Quad: 20 reps;AROM;Both(2x 10; TC to encourage ROM, cuing for upright posture) Straight Leg Raises: AROM;Left;5 reps(limited) Hip Flexion/Marching: 20 reps;AROM;Both(2x 10; TC to encourage ROM, cuing for upright posture) Toe Raises: 10 reps;Both(only activation on LLE) Heel Raises: 20  reps;Both(2x 10; VC to encourage ROM) Other Exercises Other Exercises: AMbulation over 74ft with near fall at initiation of turning to head out of room with maxA from PT to remain upright. PT cued patient to increase BOS as patient has scissor gait, patient able to correct 50%. Cuing for foot clearance which patient is able to obtain with excess hip/knee flexion, PT encouraged patient to continue to try to think about and attmpt DF  during swing phase. TC for wt shift to L side with L weight acceptance and stand phase of gait with good carry over and minA to prevent LOB. Education on DF AFO purpose to prevent toe drag/foot drop to decrease fall risk given to patient and wife.     General Comments        Pertinent Vitals/Pain Pain Assessment: No/denies pain    Home Living Family/patient expects to be discharged to:: Private residence Living Arrangements: Spouse/significant other Available Help at Discharge: Friend(s) Type of Home: Apartment Home Access: Stairs to enter   Home Layout: One level Home Equipment: None      Prior Function Level of Independence: Independent      Comments: on disability, does not drive, but otherwise completes all BADL ind   PT Goals (current goals can now be found in the care plan section) Acute Rehab PT Goals Patient Stated Goal: COntinue improving walking PT Goal Formulation: With patient Time For Goal Achievement: 05/08/18 Potential to Achieve Goals: Good    Frequency    7X/week      PT Plan      Co-evaluation              AM-PAC PT "6 Clicks" Mobility   Outcome Measure  Help needed turning from your back to your side while in a flat bed without using bedrails?: A Little Help needed moving from lying on your back to sitting on the side of a flat bed without using bedrails?: A Little Help needed moving to and from a bed to a chair (including a wheelchair)?: A Little Help needed standing up from a chair using your arms (e.g., wheelchair or bedside chair)?: A Little Help needed to walk in hospital room?: A Little Help needed climbing 3-5 steps with a railing? : A Lot 6 Click Score: 17    End of Session Equipment Utilized During Treatment: Gait belt Activity Tolerance: Patient tolerated treatment well Patient left: with call bell/phone within reach;with nursing/sitter in room;in bed;with family/visitor present Nurse Communication: Mobility status PT Visit  Diagnosis: Other abnormalities of gait and mobility (R26.89);Muscle weakness (generalized) (M62.81);Difficulty in walking, not elsewhere classified (R26.2);Other symptoms and signs involving the nervous system (R29.898);Hemiplegia and hemiparesis Hemiplegia - Right/Left: Left Hemiplegia - dominant/non-dominant: Non-dominant Hemiplegia - caused by: Other cerebrovascular disease     Time: 2993-7169 PT Time Calculation (min) (ACUTE ONLY): 31 min  Charges:  $Gait Training: 8-22 mins $Therapeutic Exercise: 8-22 mins                     Staci Acosta PT, DPT   Staci Acosta 04/26/2018, 12:50 PM

## 2018-04-26 NOTE — Progress Notes (Signed)
A&O X 4 sitting up in bed, PT denies pain, SOB and N/V at this time. Resp. regular and unlabored. Reports improved movement on his left side. Will continue to monitor.

## 2018-04-26 NOTE — Progress Notes (Signed)
Subjective: Working with PT states L sided weakness has improved.   Objective: Current vital signs: BP 97/74 (BP Location: Right Arm)   Pulse 67   Temp 97.8 F (36.6 C) (Oral)   Resp 19   Ht '6\' 1"'  (1.854 m)   Wt 111.1 kg   SpO2 100%   BMI 32.32 kg/m  Vital signs in last 24 hours: Temp:  [97.5 F (36.4 C)-98.2 F (36.8 C)] 97.8 F (36.6 C) (03/22 0737) Pulse Rate:  [62-78] 67 (03/22 0737) Resp:  [19] 19 (03/22 0415) BP: (97-135)/(74-95) 97/74 (03/22 0737) SpO2:  [96 %-100 %] 100 % (03/22 0737)  Intake/Output from previous day: 03/21 0701 - 03/22 0700 In: -  Out: 1025 [Urine:1025] Intake/Output this shift: No intake/output data recorded. Nutritional status:  Diet Order            Diet regular Room service appropriate? Yes; Fluid consistency: Thin  Diet effective now             Neurological Exam  Mental Status: Alert, oriented, thought content appropriate. Speech fluent without evidence of aphasia. Able to follow 3 step commands without difficulty. Attention span and concentration seemed appropriate  Cranial Nerves: II: Discs flat bilaterally; Visual fields grossly normal, pupils equal, round, reactive to light and accommodation III,IV, VI: ptosis not present, extra-ocular motions intact bilaterally V,VII: smile symmetric, facial light touch sensationintact VIII: hearing normal bilaterally IX,X: gag reflex present XI: bilateral shoulder shrug XII: midline tongue extension Motor: Right :Upper extremity 5/5Without pronator driftLeft: Upper extremity 4/5 without pronator drift Right:Lower extremity 4/5Left: Lower extremity 4-/5 Large amplitude tremors noted on the right upper extremity Left foot droop Tone and bulk:normal tone throughout; no atrophy noted Sensory: Pinprick and light touchdecreased on the left Deep Tendon Reflexes: 3+ (hypereflexia) and symmetric  throughout Plantars: Right:muteLeft: mute Cerebellar: Finger-to-nosetesting dysmetric on the left. Unable to perform heel to shin testing on the LLE Gait: not tested due to safety concerns  Data Reviewed  Lab Results: Basic Metabolic Panel: Recent Labs  Lab 04/23/18 1240 04/24/18 0451 04/26/18 0521  NA 140 143 140  K 4.3 3.9 4.5  CL 107 111 107  CO2 '27 23 26  ' GLUCOSE 82 98 125*  BUN 13 15 22*  CREATININE 0.86 0.89 0.89  CALCIUM 9.1 8.9 8.9    Liver Function Tests: Recent Labs  Lab 04/23/18 1240  AST 19  ALT 17  ALKPHOS 45  BILITOT 0.7  PROT 7.3  ALBUMIN 4.1   No results for input(s): LIPASE, AMYLASE in the last 168 hours. No results for input(s): AMMONIA in the last 168 hours.  CBC: Recent Labs  Lab 04/23/18 1240 04/24/18 0451  WBC 7.7 11.1*  NEUTROABS 4.4  --   HGB 16.5 16.3  HCT 48.8 48.8  MCV 92.8 93.5  PLT 329 327    Cardiac Enzymes: No results for input(s): CKTOTAL, CKMB, CKMBINDEX, TROPONINI in the last 168 hours.  Lipid Panel: No results for input(s): CHOL, TRIG, HDL, CHOLHDL, VLDL, LDLCALC in the last 168 hours.  CBG: No results for input(s): GLUCAP in the last 168 hours.  Microbiology: No results found for this or any previous visit.  Coagulation Studies: No results for input(s): LABPROT, INR in the last 72 hours.  Imaging: No results found.  Medications:  I have reviewed the patient's current medications. Prior to Admission:  Medications Prior to Admission  Medication Sig Dispense Refill Last Dose  . buPROPion (WELLBUTRIN XL) 150 MG 24 hr tablet Take 150 mg  by mouth every morning.      Scheduled: . buPROPion  150 mg Oral BH-q7a  . cholecalciferol  1,000 Units Oral Daily  . heparin injection (subcutaneous)  5,000 Units Subcutaneous Q8H  . nicotine  21 mg Transdermal Daily  . pantoprazole  40 mg Oral BID AC  . vitamin B-12  1,000 mcg Oral Daily   Assessment: 43 y.o with past medical history  of tobacco use, relapsing remitting multiple sclerosis currently not on disease modifying drugs presenting to the ED on 04/23/2018 with chief complaints of worsening upper and lower extremity weakness resulting in a fall.  Likely MS exacerbation possible progression. Patient with remote history of MS, unclear when the diagnosis was made. He report that he was on Rebiff trial at [facility] but was taken off the medication due to ineffectiveness. MRI brain reviewed and shows advanced cerebral white matter changes involving the supratentorial and infratentorial brain consistent with demyelinating disease, no evidence of active lesions noted.  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  MRI cervical spine reviewed and shows abnormal cord signal involving the central and left aspect of the cord at the level of C4-5, postcontrast enhancement compatible with active demyelination.  No acute abnormality noted on MRI lumbar spine.  Labs reviewed and shows low vitamin B12, normal folate, ANA, ESR, RF, HIV and RPR.  Pending further work-up.  Plan: 1. 3 days of solumedrol should be done today  2. Vitamin B12 maintenance dose of 1000 mcg oral daily 3. LP : CSF oligoclonal bands, IgG index pending  4.  Vitamin D3 1000 units oral daily 5. PT/OT appreciated  6.  Smoking cessation counseling 7.  JC virus pending, patient will need to follow-up with outpatient neurology for consideration of disease modifying medication for MS. 8. D/c planning and placement

## 2018-04-26 NOTE — Progress Notes (Signed)
[employer] [facility] [facility] [place] at [facility]   PATIENT NAME: Evan Reynolds    MR#:  [id number]  DATE OF BIRTH:  1975-03-22  SUBJECTIVE:  CHIEF COMPLAINT:   Chief Complaint  Patient presents with  . Fall   -Persistent left-sided weakness noted.  Patient very motivated to do rehab -Complains of worsening heartburn  REVIEW OF SYSTEMS:  Review of Systems  Constitutional: Positive for malaise/fatigue. Negative for chills and fever.  HENT: Negative for congestion, ear discharge, hearing loss and nosebleeds.   Eyes: Negative for blurred vision and double vision.  Respiratory: Negative for cough, shortness of breath and wheezing.   Cardiovascular: Negative for chest pain and palpitations.  Gastrointestinal: Positive for heartburn. Negative for abdominal pain, constipation, diarrhea, nausea and vomiting.  Genitourinary: Negative for dysuria.  Musculoskeletal: Positive for myalgias.  Neurological: Positive for focal weakness. Negative for dizziness, sensory change, speech change, seizures and headaches.  Psychiatric/Behavioral: Negative for depression.    DRUG ALLERGIES:   Allergies  Allergen Reactions  . Penicillins     VITALS:  Blood pressure 97/74, pulse 67, temperature 97.8 F (36.6 C), temperature source Oral, resp. rate 19, height 6\' 1"  (1.854 m), weight 111.1 kg, SpO2 100 %.  PHYSICAL EXAMINATION:  Physical Exam   GENERAL:  43 y.o.-year-old patient lying in the bed with no acute distress.  EYES: Pupils equal, round, reactive to light and accommodation. No scleral icterus. Extraocular muscles intact.  HEENT: Head atraumatic, normocephalic. Oropharynx and nasopharynx clear.  NECK:  Supple, no jugular venous distention. No thyroid enlargement, no tenderness.  LUNGS: Normal breath sounds bilaterally, no wheezing, rales,rhonchi or crepitation. No use of accessory muscles of respiration.  CARDIOVASCULAR: S1, S2 normal. No murmurs, rubs, or gallops.  ABDOMEN: Soft,  nontender, nondistended. Bowel sounds present. No organomegaly or mass.  EXTREMITIES: No pedal edema, cyanosis, or clubbing.  NEUROLOGIC: Cranial nerves II through XII are intact.  Right arm strength is 5/5, right lower extremity strength is 5/5 except mild foot drop noted.  Left upper extremity strength is 4/5 and left lower extremity strength is 2/5.  Sensation intact. Gait not checked.  PSYCHIATRIC: The patient is alert and oriented x 3.  SKIN: No obvious rash, lesion, or ulcer.    LABORATORY PANEL:   CBC Recent Labs  Lab 04/24/18 0451  WBC 11.1*  HGB 16.3  HCT 48.8  PLT 327   ------------------------------------------------------------------------------------------------------------------  Chemistries  Recent Labs  Lab 04/23/18 1240  04/26/18 0521  NA 140   < > 140  K 4.3   < > 4.5  CL 107   < > 107  CO2 27   < > 26  GLUCOSE 82   < > 125*  BUN 13   < > 22*  CREATININE 0.86   < > 0.89  CALCIUM 9.1   < > 8.9  AST 19  --   --   ALT 17  --   --   ALKPHOS 45  --   --   BILITOT 0.7  --   --    < > = values in this interval not displayed.   ------------------------------------------------------------------------------------------------------------------  Cardiac Enzymes No results for input(s): TROPONINI in the last 168 hours. ------------------------------------------------------------------------------------------------------------------  RADIOLOGY:  Dg Fl Guided Lumbar Puncture  Result Date: 04/24/2018 CLINICAL DATA:  Multiple sclerosis. Patient reports he began experiencing left side numbness x 3 days ago. Reports he was diagnosed with multiple sclerosis 20 years ago and he has previously experienced right sided numbness. EXAM: DIAGNOSTIC  LUMBAR PUNCTURE UNDER FLUOROSCOPIC GUIDANCE FLUOROSCOPY TIME:  Fluoroscopy Time:  12 seconds PROCEDURE: Informed consent was obtained from the patient prior to the procedure, including potential complications of headache, allergy,  and pain. With the patient prone, the lower back was prepped with Betadine. 1% Lidocaine was used for local anesthesia. Lumbar puncture was performed at the L4-5 level using a 22 gauge needle with return of clear CSF. 8 ml of CSF were obtained for laboratory studies. The patient tolerated the procedure well and there were no apparent complications. Patient was transported back to the patient's room in stable condition. IMPRESSION: Successful fluoroscopic guided diagnostic lumbar puncture. Electronically Signed   By: Elige Ko   On: 04/24/2018 11:03    EKG:   Orders placed or performed in visit on 07/22/07  . EKG 12-Lead    ASSESSMENT AND PLAN:   43 year old male with past medical history significant for ongoing smoking, relapsing remitting multiple sclerosis not on any maintenance medications presents to hospital secondary to worsening left-sided weakness.  1.  Multiple sclerosis exacerbation-MRI of the brain and also C-spine indicating active demyelination and advanced white matter changes consistent with demyelinating disease. -No evidence of any stroke. -Appreciate neurology consult.  Lumbar puncture done.  No infection identified -Continue IV Solu-Medrol for now. -Outpatient follow-up with neurology needed to start on disease modifying agents for MS  2.  Low B12 levels-being replaced orally  3.  Depression-on Wellbutrin  4.  Tobacco use disorder-continue nicotine patch  5.  DVT prophylaxis-  subcutaneous heparin  6.  GERD symptoms-change Protonix to twice daily  PT recommending CIR at this time   All the records are reviewed and case discussed with Care Management/Social Workerr. Management plans discussed with the patient, family and they are in agreement.  CODE STATUS: Full code  TOTAL TIME TAKING CARE OF THIS PATIENT: 36 minutes.   POSSIBLE D/C IN 1-2 DAYS, DEPENDING ON CLINICAL CONDITION.   Enid Baas M.D on 04/26/2018 at 9:34 AM  Between 7am to 6pm -  Pager - [phone]  After 6pm go to [email or web address] - password [email or web address]  [facility]  Office  [phone]  CC: Primary care physician; Patient, No Pcp Per

## 2018-04-27 LAB — JC VIRUS DNA,PCR (WHOLE BLOOD): JC VIRUS DNA: NEGATIVE

## 2018-04-27 LAB — IGG CSF INDEX
Albumin CSF-mCnc: 14 mg/dL (ref 11–48)
Albumin: 4.3 g/dL (ref 4.0–5.0)
CSF IgG Index: 0.7 (ref 0.0–0.7)
IGG (IMMUNOGLOBIN G), SERUM: 989 mg/dL (ref 700–1600)
IgG, CSF: 2.2 mg/dL (ref 0.0–8.6)
IgG/Alb Ratio, CSF: 0.16 (ref 0.00–0.25)

## 2018-04-27 LAB — VDRL, CSF: VDRL Quant, CSF: NONREACTIVE

## 2018-04-27 MED ORDER — DOCUSATE SODIUM 100 MG PO CAPS
100.0000 mg | ORAL_CAPSULE | Freq: Two times a day (BID) | ORAL | Status: DC
  Administered 2018-04-27 – 2018-04-28 (×3): 100 mg via ORAL
  Filled 2018-04-27 (×3): qty 1

## 2018-04-27 MED ORDER — VITAMIN D (ERGOCALCIFEROL) 1.25 MG (50000 UNIT) PO CAPS
50000.0000 [IU] | ORAL_CAPSULE | ORAL | Status: DC
  Administered 2018-04-27: 50000 [IU] via ORAL
  Filled 2018-04-27: qty 1

## 2018-04-27 MED ORDER — BISACODYL 10 MG RE SUPP
10.0000 mg | Freq: Every day | RECTAL | Status: DC | PRN
  Administered 2018-04-28: 10 mg via RECTAL
  Filled 2018-04-27: qty 1

## 2018-04-27 MED ORDER — VITAMIN D 25 MCG (1000 UNIT) PO TABS
2000.0000 [IU] | ORAL_TABLET | Freq: Every day | ORAL | Status: DC
  Administered 2018-04-28: 2000 [IU] via ORAL
  Filled 2018-04-27: qty 2

## 2018-04-27 MED ORDER — POLYETHYLENE GLYCOL 3350 17 G PO PACK
17.0000 g | PACK | Freq: Every day | ORAL | Status: DC
  Administered 2018-04-27: 17 g via ORAL
  Filled 2018-04-27: qty 1

## 2018-04-27 NOTE — Progress Notes (Signed)
Inpatient Rehabilitation Admissions Coordinator  Noted pt on Day 4 of 5 of IV solumedrol with motor strength improvement per Dr. Nemiah Commander. I await updated therapy progress today and tomorrow to assess if pt appropriate for an inpt rehab admit. I will contact pt today after therapy updates to discuss. I have discussed with Marice Potter RN CM.  Ottie Glazier, RN, MSN Rehab Admissions Coordinator [phone] 04/27/2018 10:16 AM

## 2018-04-27 NOTE — Care Management (Signed)
Called and left a secure VM requesting to see if the patient can come to CIR today, requested a call back and provided call back number

## 2018-04-27 NOTE — Progress Notes (Signed)
[employer] - [place] at [facility]   PATIENT NAME: Evan Reynolds    MR#:  [id number]  DATE OF BIRTH:  Feb 23, 1975  SUBJECTIVE:  CHIEF COMPLAINT:   Chief Complaint  Patient presents with  . Fall   -Feels much better.  Some stiffness of left upper extremity with improved motor strength.  Improving left lower extremity motor strength as well  REVIEW OF SYSTEMS:  Review of Systems  Constitutional: Positive for malaise/fatigue. Negative for chills and fever.  HENT: Negative for congestion, ear discharge, hearing loss and nosebleeds.   Eyes: Negative for blurred vision and double vision.  Respiratory: Negative for cough, shortness of breath and wheezing.   Cardiovascular: Negative for chest pain and palpitations.  Gastrointestinal: Negative for abdominal pain, constipation, diarrhea, heartburn, nausea and vomiting.  Genitourinary: Negative for dysuria.  Musculoskeletal: Positive for myalgias.  Neurological: Positive for focal weakness. Negative for dizziness, sensory change, speech change, seizures and headaches.  Psychiatric/Behavioral: Negative for depression.    DRUG ALLERGIES:   Allergies  Allergen Reactions  . Penicillins     VITALS:  Blood pressure 134/87, pulse (!) 57, temperature 97.7 F (36.5 C), temperature source Oral, resp. rate 15, height 6\' 1"  (1.854 m), weight 111.1 kg, SpO2 100 %.  PHYSICAL EXAMINATION:  Physical Exam   GENERAL:  43 y.o.-year-old patient lying in the bed with no acute distress.  EYES: Pupils equal, round, reactive to light and accommodation. No scleral icterus. Extraocular muscles intact.  HEENT: Head atraumatic, normocephalic. Oropharynx and nasopharynx clear.  NECK:  Supple, no jugular venous distention. No thyroid enlargement, no tenderness.  LUNGS: Normal breath sounds bilaterally, no wheezing, rales,rhonchi or crepitation. No use of accessory muscles of respiration.  CARDIOVASCULAR: S1, S2 normal. No murmurs, rubs, or  gallops.  ABDOMEN: Soft, nontender, nondistended. Bowel sounds present. No organomegaly or mass.  EXTREMITIES: No pedal edema, cyanosis, or clubbing.  NEUROLOGIC: Cranial nerves II through XII are intact.  Right arm strength is 5/5, right lower extremity strength is 5/5 except mild foot drop noted.  Left upper extremity strength is 4/5 and left lower extremity strength is 3/5.  Sensation intact. Gait not checked.  PSYCHIATRIC: The patient is alert and oriented x 3.  SKIN: No obvious rash, lesion, or ulcer.    LABORATORY PANEL:   CBC Recent Labs  Lab 04/24/18 0451  WBC 11.1*  HGB 16.3  HCT 48.8  PLT 327   ------------------------------------------------------------------------------------------------------------------  Chemistries  Recent Labs  Lab 04/23/18 1240  04/26/18 0521  NA 140   < > 140  K 4.3   < > 4.5  CL 107   < > 107  CO2 27   < > 26  GLUCOSE 82   < > 125*  BUN 13   < > 22*  CREATININE 0.86   < > 0.89  CALCIUM 9.1   < > 8.9  AST 19  --   --   ALT 17  --   --   ALKPHOS 45  --   --   BILITOT 0.7  --   --    < > = values in this interval not displayed.   ------------------------------------------------------------------------------------------------------------------  Cardiac Enzymes No results for input(s): TROPONINI in the last 168 hours. ------------------------------------------------------------------------------------------------------------------  RADIOLOGY:  No results found.  EKG:   Orders placed or performed in visit on 07/22/07  . EKG 12-Lead    ASSESSMENT AND PLAN:   43 year old male with past medical history significant for ongoing smoking, relapsing remitting  multiple sclerosis not on any maintenance medications presents to hospital secondary to worsening left-sided weakness.  1.  Multiple sclerosis exacerbation-MRI of the brain and also C-spine indicating active demyelination and advanced white matter changes consistent with  demyelinating disease. -No evidence of any stroke. -Appreciate neurology consult.  Lumbar puncture done.  No infection identified -Continue IV Solu-Medrol for now- for 3-5 days. Today is day 4. With improvement -Outpatient follow-up with neurology needed to start on disease modifying agents for MS  2.  Low B12 levels-being replaced orally  3.  Depression-on Wellbutrin  4.  Tobacco use disorder-continue nicotine patch  5.  DVT prophylaxis-  subcutaneous heparin  6.  GERD symptoms-changed Protonix to twice daily  PT recommending CIR at this time- possible discharge today or tomorrow   All the records are reviewed and case discussed with Care Management/Social Workerr. Management plans discussed with the patient, family and they are in agreement.  CODE STATUS: Full code  TOTAL TIME TAKING CARE OF THIS PATIENT: 36 minutes.   POSSIBLE D/C IN 1-2 DAYS, DEPENDING ON CLINICAL CONDITION.   Enid Baas M.D on 04/27/2018 at 9:44 AM  Between 7am to 6pm - Pager - [phone]  After 6pm go to [email or web address] - password [email or web address]  [facility] [place] [facility] [place]  Office  [phone]  CC: Primary care physician; Patient, No Pcp Per

## 2018-04-27 NOTE — Care Management Important Message (Signed)
Important Message  Patient Details  Name: Evan Reynolds. MRN: [id number] Date of Birth: 1975-10-14   Medicare Important Message Given:  Yes    Olegario Messier A Lamyah Creed 04/27/2018, 11:08 AM

## 2018-04-27 NOTE — Progress Notes (Signed)
Occupational Therapy Treatment Patient Details Name: Evan Reynolds. MRN: [id number] DOB: 1975/09/18 Today's Date: 04/27/2018    History of present illness Pt is a 43 y.o. male presenting to hospital 04/23/18 with increasing UE/LE weakness resulting in fall (L>R weakness).  PMH includes relapsing remitting MS.   OT comments  Pt. Reported being concerned that he may need to talk to someone about Anxiety, and to explore medication options for Anxiety. Pt. reports worrying more. Nursing was notified. Pt. had difficulty hold objects in his left hand, and has an increase in spilling items including the urinal, and coffee. Pt. worked with yellow theraputty ex. for left lateral pinch, 3pt. pinch strength, 2pt. pinch thumb opposition, and gross digit extension. Pt. Required verbal cues, and assist for proper technique. Pt. Continues to benefit from OT services for ADL training, A/E training, neuromuscular re-ed. UE there. Ex., and pt. education about home modification, and DME. Pt. could benefit form CIR with follow-up OT services.   Follow Up Recommendations  CIR    Equipment Recommendations  Other (comment)    Recommendations for Other Services      Precautions / Restrictions Precautions Precautions: Fall Precaution Comments: L sided weakness Restrictions Weight Bearing Restrictions: No       Mobility Bed Mobility               General bed mobility comments: Deferred (pt sitting in chair at beginning and end of session)  Transfers Overall transfer level: Needs assistance Equipment used: Rolling walker (2 wheeled) Transfers: Sit to/from Stand Sit to Stand: Min guard;Min assist         General transfer comment: mild unsteadiness upon standing (min assist for balance) but improved ability to stand with CGA from recliner    Balance Overall balance assessment: Needs assistance Sitting-balance support: No upper extremity supported Sitting balance-Leahy Scale: Good Sitting  balance - Comments: steady sitting reaching within BOS   Standing balance support: No upper extremity supported Standing balance-Leahy Scale: Fair Standing balance comment: steady static standing but requiring assist for balance with any dynamic movement d/t L LE weakness                           ADL either performed or assessed with clinical judgement   ADL Overall ADL's : Needs assistance/impaired Eating/Feeding: Set up;Sitting   Grooming: Minimal assistance;Sitting   Upper Body Bathing: Minimal assistance;Sitting;Cueing for compensatory techniques   Lower Body Bathing: Maximal assistance;Sit to/from stand;Sitting/lateral leans;Cueing for compensatory techniques   Upper Body Dressing : Minimal assistance;Sitting;Cueing for compensatory techniques   Lower Body Dressing: Maximal assistance;Sit to/from stand;Sitting/lateral leans;Cueing for compensatory techniques     Toilet Transfer Details (indicate cue type and reason): Pt. had difficulty handling the urinal                 Vision       Perception     Praxis      Cognition Arousal/Alertness: Awake/alert Behavior During Therapy: WFL for tasks assessed/performed Overall Cognitive Status: Within Functional Limits for tasks assessed                                          Exercises    Shoulder Instructions       General Comments      Pertinent Vitals/ Pain       Pain Assessment: 0-10  Pain Score: 2  Pain Location: LUE, and LE Pain Descriptors / Indicators: Discomfort Pain Intervention(s): Monitored during session;Repositioned  Home Living                                          Prior Functioning/Environment              Frequency  Min 3X/week        Progress Toward Goals  OT Goals(current goals can now be found in the care plan section)     Acute Rehab OT Goals Patient Stated Goal: To regain LUE functioning OT Goal Formulation: With  patient Time For Goal Achievement: 05/09/18 Potential to Achieve Goals: Good  Plan      Co-evaluation                 AM-PAC OT "6 Clicks" Daily Activity     Outcome Measure   Help from another person eating meals?: A Little Help from another person taking care of personal grooming?: A Little Help from another person toileting, which includes using toliet, bedpan, or urinal?: A Lot Help from another person bathing (including washing, rinsing, drying)?: A Lot Help from another person to put on and taking off regular upper body clothing?: A Little Help from another person to put on and taking off regular lower body clothing?: A Lot 6 Click Score: 15    End of Session Equipment Utilized During Treatment: Gait belt;Rolling walker  OT Visit Diagnosis: Unsteadiness on feet (R26.81);Other abnormalities of gait and mobility (R26.89);Muscle weakness (generalized) (M62.81);Other symptoms and signs involving the nervous system (R29.898)   Activity Tolerance Patient tolerated treatment well   Patient Left in bed;with call bell/phone within reach;with family/visitor present   Nurse Communication          Time: 6004-5997 OT Time Calculation (min): 30 min  Charges: OT General Charges $OT Visit: 1 Visit OT Treatments $Self Care/Home Management : 23-37 mins  Olegario Messier, MS, OTR/L  Olegario Messier 04/27/2018, 4:12 PM

## 2018-04-27 NOTE — TOC Progression Note (Signed)
Transition of Care (TOC) - Progression Note  Spoke With Britta Mccreedy from [facility] and she will reassess once PT sees the patient today and she will review with the physician at [facility] and let me know if they are able to take the patient tomorrow    Patient Details  Name: Evan Reynolds. MRN: [id number] Date of Birth: Nov 08, 1975  Transition of Care [facility]) CM/SW Contact  Barrie Dunker, RN Phone Number: 04/27/2018, 10:10 AM  Clinical Narrative:            Expected Discharge Plan and Services                                     Social Determinants of Health (SDOH) Interventions    Readmission Risk Interventions No flowsheet data found.

## 2018-04-27 NOTE — Progress Notes (Signed)
Inpatient Rehabilitation Admissions Coordinator  Inpatient Rehab Consult received. I reviewed chart and contacted patient by phone for rehabilitation assessment. We discussed goals and expectations of an inpatient rehab admission.  Patient recently moved from [facility] two months ago where he was homeless. Currently living with his girlfriend, Lupita Leash who is retired. Neither patient or his girlfriend drive. Patient notes improved functional mobility daily but complains of left arm continues to feel numb. I will review therapy notes when available to assist with planning dispo for CIR admit vs home as appropriate. I will follow up tomorrow.  Ottie Glazier, RN, MSN Rehab Admissions Coordinator [phone] 04/27/2018 11:19 AM

## 2018-04-27 NOTE — Progress Notes (Signed)
Physical Therapy Treatment Patient Details Name: Evan Reynolds. MRN: [id number] DOB: 04-Sep-1975 Today's Date: 04/27/2018    History of Present Illness Pt is a 43 y.o. male presenting to hospital 04/23/18 with increasing UE/LE weakness resulting in fall (L>R weakness).  PMH includes relapsing remitting MS.    PT Comments    Pt able to gradually progress to ambulating 120 feet with RW today but required min assist for balance (pt noted with narrow BOS, increased difficulty advancing L LE with distance, and increased L knee instability--hyperextension-- with increased distance ambulating) and required vc's/tactile cues to improve gait technique.  Pt continues to be very motivated to participate in PT and improve mobility; pt educated on modification of activity and pacing d/t MS diagnosis.  Will continue to focus on strengthening, balance, and progressive mobility during hospital stay.   Follow Up Recommendations  CIR     Equipment Recommendations  Rolling walker with 5" wheels    Recommendations for Other Services Rehab consult     Precautions / Restrictions Precautions Precaution Comments: L sided weakness Restrictions Weight Bearing Restrictions: No    Mobility  Bed Mobility               General bed mobility comments: Deferred (pt sitting in chair at beginning and end of session)  Transfers Overall transfer level: Needs assistance Equipment used: Rolling walker (2 wheeled) Transfers: Sit to/from Stand Sit to Stand: Min guard;Min assist         General transfer comment: mild unsteadiness upon standing (min assist for balance) but improved ability to stand with CGA from recliner  Ambulation/Gait Ambulation/Gait assistance: Min assist Gait Distance (Feet): 120 Feet Assistive device: Rolling walker (2 wheeled)   Gait velocity: decreased   General Gait Details: narrow BOS intermittently requiring cueing to increase BOS; pt initially able to compensate (shifting  to R and rotating L hip forward) to advance L LE but with increased distance pt appearing to fatigue and started to slide L foot forward instead; increased L knee instability noted with increased distance ambulating requiring tactile cueing to prevent L knee hyperextension during L LE stance phase   Stairs             Wheelchair Mobility    Modified Rankin (Stroke Patients Only)       Balance Overall balance assessment: Needs assistance Sitting-balance support: No upper extremity supported Sitting balance-Leahy Scale: Good Sitting balance - Comments: steady sitting reaching within BOS   Standing balance support: No upper extremity supported Standing balance-Leahy Scale: Fair Standing balance comment: steady static standing but requiring assist for balance with any dynamic movement d/t L LE weakness                            Cognition Arousal/Alertness: Awake/alert Behavior During Therapy: WFL for tasks assessed/performed Overall Cognitive Status: Within Functional Limits for tasks assessed                                        Exercises General Exercises - Lower Extremity Ankle Circles/Pumps: AAROM;Strengthening;Left;10 reps;Seated Long Arc Quad: AROM;Strengthening;Both;10 reps;Seated Hip Flexion/Marching: AROM;Right;AAROM;Left;Strengthening;10 reps;Seated    General Comments  L hip flexion 3-/5; L knee extension 4-/5; L knee flexion 4-/5; L DF 2-/5      Pertinent Vitals/Pain Pain Assessment: 0-10 Pain Score: 2  Pain Location: discomfort L arm and  leg Pain Descriptors / Indicators: Discomfort Pain Intervention(s): Limited activity within patient's tolerance;Monitored during session;Repositioned  Vitals (HR and O2 on room air) stable and WFL throughout treatment session.    Home Living                      Prior Function            PT Goals (current goals can now be found in the care plan section) Acute Rehab PT  Goals Patient Stated Goal: to improve walking PT Goal Formulation: With patient Time For Goal Achievement: 05/08/18 Potential to Achieve Goals: Good Progress towards PT goals: Progressing toward goals    Frequency    7X/week      PT Plan Current plan remains appropriate    Co-evaluation              AM-PAC PT "6 Clicks" Mobility   Outcome Measure  Help needed turning from your back to your side while in a flat bed without using bedrails?: A Little Help needed moving from lying on your back to sitting on the side of a flat bed without using bedrails?: A Little Help needed moving to and from a bed to a chair (including a wheelchair)?: A Little Help needed standing up from a chair using your arms (e.g., wheelchair or bedside chair)?: A Little Help needed to walk in hospital room?: A Little Help needed climbing 3-5 steps with a railing? : A Lot 6 Click Score: 17    End of Session Equipment Utilized During Treatment: Gait belt Activity Tolerance: Patient tolerated treatment well Patient left: in chair;with call bell/phone within reach;with chair alarm set Nurse Communication: Mobility status;Precautions PT Visit Diagnosis: Other abnormalities of gait and mobility (R26.89);Muscle weakness (generalized) (M62.81);Difficulty in walking, not elsewhere classified (R26.2);Other symptoms and signs involving the nervous system (R29.898);Hemiplegia and hemiparesis Hemiplegia - Right/Left: Left Hemiplegia - dominant/non-dominant: Non-dominant Hemiplegia - caused by: Other cerebrovascular disease     Time: 0951-1015 PT Time Calculation (min) (ACUTE ONLY): 24 min  Charges:  $Gait Training: 8-22 mins $Therapeutic Exercise: 8-22 mins                    Hendricks Limes, PT 04/27/18, 2:06 PM [phone]

## 2018-04-27 NOTE — Progress Notes (Addendum)
Subjective: Patient continues to make progress, working with PT/OT. No new concerns reported.  Objective: Current vital signs: BP 134/87 (BP Location: Left Arm)   Pulse (!) 57   Temp 97.7 F (36.5 C) (Oral)   Resp 15   Ht _0  (1.854 m)   Wt 111.1 kg   SpO2 100%   BMI 32.32 kg/m  Vital signs in last 24 hours: Temp:  [97.6 F (36.4 C)-98 F (36.7 C)] 97.7 F (36.5 C) (03/23 0744) Pulse Rate:  [54-75] 57 (03/23 0744) Resp:  [15-19] 15 (03/23 0744) BP: (109-134)/(76-87) 134/87 (03/23 0744) SpO2:  [97 %-100 %] 100 % (03/23 0744)  Intake/Output from previous day: 03/22 0701 - 03/23 0700 In: 150 [P.O.:150] Out: 600 [Urine:600] Intake/Output this shift: Total I/O In: -  Out: 100 [Urine:100] Nutritional status:  Diet Order            Diet regular Room service appropriate? Yes; Fluid consistency: Thin  Diet effective now             Neurologic Exam: Mental Status: Alert, oriented, thought content appropriate. Speech fluent without evidence of aphasia. Able to follow 3 step commands without difficulty. Attention span and concentration seemed appropriate  Motor: Patient ambulates with a walker and the assistance of PT.  Left foot drop noted.    Data Reviewed  Lab Results: Basic Metabolic Panel: Recent Labs  Lab 04/23/18 1240 04/24/18 0451 04/26/18 0521  NA 140 143 140  K 4.3 3.9 4.5  CL 107 111 107  CO2 _1 GLUCOSE 82 98 125*  BUN 13 15 22*  CREATININE 0.86 0.89 0.89  CALCIUM 9.1 8.9 8.9    Liver Function Tests: Recent Labs  Lab 04/23/18 1240  AST 19  ALT 17  ALKPHOS 45  BILITOT 0.7  PROT 7.3  ALBUMIN 4.1   No results for input(s): LIPASE, AMYLASE in the last 168 hours. No results for input(s): AMMONIA in the last 168 hours.  CBC: Recent Labs  Lab 04/23/18 1240 04/24/18 0451  WBC 7.7 11.1*  NEUTROABS 4.4  --   HGB 16.5 16.3  HCT 48.8 48.8  MCV 92.8 93.5  PLT 329 327    Cardiac Enzymes: No results for input(s): CKTOTAL,  CKMB, CKMBINDEX, TROPONINI in the last 168 hours.  Lipid Panel: No results for input(s): CHOL, TRIG, HDL, CHOLHDL, VLDL, LDLCALC in the last 168 hours.  CBG: No results for input(s): GLUCAP in the last 168 hours.  Microbiology: No results found for this or any previous visit.  Coagulation Studies: No results for input(s): LABPROT, INR in the last 72 hours.  Imaging: No results found.  Medications:  I have reviewed the patient's current medications. Prior to Admission:  Medications Prior to Admission  Medication Sig Dispense Refill Last Dose  . buPROPion (WELLBUTRIN XL) 150 MG 24 hr tablet Take 150 mg by mouth every morning.      Scheduled: . buPROPion  150 mg Oral BH-q7a  . [START ON 04/28/2018] cholecalciferol  2,000 Units Oral Daily  . heparin injection (subcutaneous)  5,000 Units Subcutaneous Q8H  . nicotine  21 mg Transdermal Daily  . pantoprazole  40 mg Oral BID AC  . vitamin B-12  1,000 mcg Oral Daily  . Vitamin D (Ergocalciferol)  50,000 Units Oral Q Mon   Patient seen and examined.  Clinical course and management discussed.  Necessary edits performed.  I agree with the above.  Assessment and plan of care developed and discussed below.  Assessment: 43 y.o with past medical history of tobacco use, relapsing remitting multiple sclerosis currently not on disease modifying drugs presenting [facility]19/2020with chief complaints of worsening upper and lower extremity weakness resulting in a fall.  Likely MS exacerbation possible progression.Patient with remote history of MS, unclear when the diagnosis was made. He report that he was on Rebiff trial at [facility] but was taken off the medication due to ineffectiveness. MRI brain reviewed and shows advanced cerebral white matter changes involving the supratentorial and infratentorial brain consistent with demyelinating disease, no evidence of active lesions noted.  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   MRI cervical spine reviewed and shows abnormal cord signal involving the central and left aspect of the cord at the level of C4-5, postcontrast enhancement compatible with active demyelination.  No acute abnormality noted on MRI lumbar spine.  Labs reviewed and shows low vitamin B12 and Vitamin D. Normal folate, ANA, ESR, RF, HIV and RPR.  Pending CSF labs. Patient continues to improve.  Plan: 1.  Continue solumedrol IV for 5 days.  Today is day #4.  Should complete course in AM with no necessity of steroid taper after completion. 2. Vitamin B12 maintenance dose of 1000 mcg oral daily 3. LP :CSF oligoclonal bands, IgG index pending  4.  Vitamin D3 replacement 5. PT/OT appreciated  6.  Smoking cessation counseling 7.  JC virus pending, patient will need to follow-up with outpatient neurology for consideration of disease modifying medication for MS. 8. Agree with CIR placement for continued rehab  This patient was staffed with Dr. Magda Paganini, Doy Mince who personally evaluated patient, reviewed documentation and agreed with assessment and plan of care as above.  Rufina Falco, DNP, FNP-BC Board certified Nurse Practitioner Neurology Department    LOS: 4 days   04/27/2018  10:36 AM  Case discussed with Dr. Samuella Cota, MD Neurology [phone]  04/27/2018  10:52 AM

## 2018-04-28 ENCOUNTER — Emergency Department
Admission: EM | Admit: 2018-04-28 | Discharge: 2018-04-29 | Disposition: A | Discharge status: Home / Self Care | Payer: Medicare Other | Attending: Emergency Medicine | Admitting: Emergency Medicine

## 2018-04-28 DIAGNOSIS — G35 Multiple sclerosis: Secondary | ICD-10-CM | POA: Diagnosis not present

## 2018-04-28 DIAGNOSIS — F17200 Nicotine dependence, unspecified, uncomplicated: Secondary | ICD-10-CM | POA: Insufficient documentation

## 2018-04-28 LAB — OLIGOCLONAL BANDS, CSF + SERM

## 2018-04-28 MED ORDER — BUPROPION HCL ER (XL) 150 MG PO TB24: 150.0000 mg | ORAL_TABLET | ORAL | 2 refills | Status: DC

## 2018-04-28 MED ORDER — CYANOCOBALAMIN 1000 MCG PO TABS: 1000.0000 ug | ORAL_TABLET | Freq: Every day | ORAL | 2 refills | Status: DC

## 2018-04-28 MED ORDER — METHOCARBAMOL 750 MG PO TABS: 750.0000 mg | ORAL_TABLET | Freq: Three times a day (TID) | ORAL | 0 refills | Status: DC | PRN

## 2018-04-28 MED ORDER — VITAMIN D 25 MCG (1000 UNIT) PO TABS: 2000.0000 [IU] | ORAL_TABLET | Freq: Every day | ORAL | 2 refills | Status: DC

## 2018-04-28 NOTE — TOC Progression Note (Signed)
Transition of Care (TOC) - Progression Note  Spoke with Britta Mccreedy with CIR this morning, she stated that she has spoken with the patient multiple times including this morning and he is doing much better so he will be going home, The patient is reported to still have numbness in the left hand but is improving with current treatment, RNCM will continue to monitor for home needs   Patient Details  Name: Evan Reynolds. MRN: [id number] Date of Birth: 1975/03/18  Transition of Care [facility]) CM/SW Contact  Barrie Dunker, RN Phone Number: 04/28/2018, 9:00 AM  Clinical Narrative:            Expected Discharge Plan and Services                                     Social Determinants of Health (SDOH) Interventions    Readmission Risk Interventions No flowsheet data found.

## 2018-04-28 NOTE — TOC Transition Note (Signed)
Transition of Care [facility]) - CM/SW Discharge Note   Patient Details  Name: Evan Reynolds. MRN: [id number] Date of Birth: 21-Dec-1975  Transition of Care [facility]) CM/SW Contact:  Barrie Dunker, RN Phone Number: 04/28/2018, 12:11 PM   Clinical Narrative:    Patient does not want to wait on DME equipment to be delivered to the room and says it wont fiut in his friend's car so he chose to have it delivered to his home, Brad with [employer] agreed to deliver to the patient's home   Final next level of care: Home/Self Care Barriers to Discharge: Barriers Resolved   Patient Goals and CMS Choice Patient states their goals for this hospitalization and ongoing recovery are:: to go home      Discharge Placement                       Discharge Plan and Services   Discharge Planning Services: CM Consult            DME Arranged: 3-N-1, Walker rolling DME Agency: [employer]       Social Determinants of Health (SDOH) Interventions     Readmission Risk Interventions No flowsheet data found.

## 2018-04-28 NOTE — Progress Notes (Signed)
Inpatient Rehabilitation Admissions Coordinator  I contacted patient by phone to offer admission today to [facility]. He is concerned about the distance from his girlfriend, Evan Reynolds, and prefers d/c home today. He states he has improved with his functional mobility except for LUE still numb, but prefers d/c home today. I have contacted RN CM, Deliliah, to discuss. We will sign off at this time.  Ottie Glazier, RN, MSN Rehab Admissions Coordinator [phone] 04/28/2018 8:13 AM

## 2018-04-28 NOTE — ED Triage Notes (Signed)
Pt arrived from home via EMS with complaints of "MS attack". Pt states that he woke up jerking and he "felt funny". Pt also reports weakness in arms and legs. Pt states that he normally walks around with no issues.VS per EMS BP-142/86 HR-90 O2sat-98%RA. Dr. Derrill Kay at bedside

## 2018-04-28 NOTE — TOC Progression Note (Signed)
Transition of Care (TOC) - Progression Note    Patient Details  Name: Evan Reynolds. MRN: [id number] Date of Birth: October 01, 1975  Transition of Care [facility]) CM/SW Contact  Barrie Dunker, RN Phone Number: 04/28/2018, 10:15 AM  Clinical Narrative:     Patient is trying to reach his friend Brett Canales to transport home, he wants to go home and needs a RW and 3 in 1  Notified Brad with [employer] that the patient needs equipment today for discharge home  Expected Discharge Plan: Home/Self Care Barriers to Discharge: Barriers Resolved  Expected Discharge Plan and Services Expected Discharge Plan: Home/Self Care   Discharge Planning Services: CM Consult   Living arrangements for the past 2 months: Single Family Home Expected Discharge Date: 04/28/18               DME Arranged: 3-N-1, Walker rolling DME Agency: [employer]       Social Determinants of Health (SDOH) Interventions    Readmission Risk Interventions No flowsheet data found.

## 2018-04-28 NOTE — Progress Notes (Signed)
Occupational Therapy Treatment Patient Details Name: Evan Reynolds. MRN: [id number] DOB: 1975/06/03 Today's Date: 04/28/2018    History of present illness Pt is a 43 y.o. male presenting to hospital 04/23/18 with increasing UE/LE weakness resulting in fall (L>R weakness).  PMH includes relapsing remitting MS.   OT comments  Pt seen for ADL retraining to assess for needs since he now wants to go home and is declining CIR.  Pt required CGA using FWW for short distance from bed to toilet and then to sink to wash hands with CGA.  He was able to clear left foot when walking which he stated was an improvement.  Discussed rec for [facility] and shower chair to increase safety and prevent falls at home and use BSC next to bed at night and in shower stall but he was not sure if it would fit or not.  Attempted to call Deliliah twice and line was busy.  Spoke to [place] from PT about change in discharge and Dr Nemiah Commander asking if this was a safe discharge plan. Reviewed HEP with patient to continue working on fine motor skills and strengthening and rec consideration of using a Universal cuff for self feeding with R hand vs using L hand.  [employer] equipment catalog given to patient with recommendations. Rec OT HH.  Follow Up Recommendations  Home health OT    Equipment Recommendations  3 in 1 bedside commode;Tub/shower seat    Recommendations for Other Services      Precautions / Restrictions Precautions Precautions: Fall Precaution Comments: L sided weakness Restrictions Weight Bearing Restrictions: No       Mobility Bed Mobility                  Transfers                      Balance                                           ADL either performed or assessed with clinical judgement   ADL Overall ADL's : Needs assistance/impaired Eating/Feeding: Set up;Sitting   Grooming: Wash/dry hands;Wash/dry face;Set up;Supervision/safety Grooming Details (indicate cue  type and reason): supervision to wash hands and face at sink using FWW this session                 Toilet Transfer: Minimal assistance;Set up;Supervision/safety;RW Toilet Transfer Details (indicate cue type and reason): practiced toilet transfer with FWW and min assist for safe transfer on and off toilet--he has a sink at home on L to hold onto which is weak side and he states his girlfriend can help him.  Rec BSC next to bed to prevent falls if he needs to go at night and can use in shower stall.           General ADL Comments: Pt was able to ambulate with FWW with CGA and no buckling or dragging of L foot for short distance to toilet and sink.  Will discuss with Deliliah, CM, about rec for [facility], shower chair wtih back and FWW that is adjustable.  He might also need transportation home to apartment if his friend Viviann Spare cannot pick him up.       Vision Patient Visual Report: No change from baseline     Perception     Praxis  Cognition Arousal/Alertness: Awake/alert Behavior During Therapy: WFL for tasks assessed/performed Overall Cognitive Status: Within Functional Limits for tasks assessed                                          Exercises     Shoulder Instructions       General Comments      Pertinent Vitals/ Pain       Pain Assessment: 0-10 Pain Score: 1  Pain Location: LUE, and LE Pain Descriptors / Indicators: Discomfort Pain Intervention(s): Monitored during session;Repositioned  Home Living                                          Prior Functioning/Environment              Frequency  Min 3X/week        Progress Toward Goals  OT Goals(current goals can now be found in the care plan section)  Progress towards OT goals: Progressing toward goals  Acute Rehab OT Goals Patient Stated Goal: To regain LUE functioning OT Goal Formulation: With patient Time For Goal Achievement: 05/09/18 Potential to Achieve  Goals: Good  Plan Discharge plan needs to be updated    Co-evaluation                 AM-PAC OT "6 Clicks" Daily Activity     Outcome Measure   Help from another person eating meals?: A Little Help from another person taking care of personal grooming?: None Help from another person toileting, which includes using toliet, bedpan, or urinal?: A Little Help from another person bathing (including washing, rinsing, drying)?: A Little Help from another person to put on and taking off regular upper body clothing?: None Help from another person to put on and taking off regular lower body clothing?: A Little 6 Click Score: 20    End of Session Equipment Utilized During Treatment: Gait belt;Rolling walker  OT Visit Diagnosis: Unsteadiness on feet (R26.81);Other abnormalities of gait and mobility (R26.89);Muscle weakness (generalized) (M62.81);Other symptoms and signs involving the nervous system (R29.898)   Activity Tolerance Patient tolerated treatment well   Patient Left in bed;with call bell/phone within reach   Nurse Communication          Time: 1155-2080 OT Time Calculation (min): 35 min  Charges: OT General Charges $OT Visit: 1 Visit OT Treatments $Self Care/Home Management : 8-22 mins $Therapeutic Exercise: 8-22 mins  Susanne Borders, OTR/L Ascom:  [phone] 04/28/18, 9:58 AM

## 2018-04-28 NOTE — Progress Notes (Signed)
Patient is alert and oriented and able to verbalize needs. PIV removed. VSS> Printed AVS and scripts given to patient at this time. No concerns voiced. All belongings packed. Patient called friend to transport him home.   Suzan Slick, RN

## 2018-04-28 NOTE — Discharge Summary (Signed)
[facility] - [place] at [facility]   PATIENT NAME: Evan Reynolds    MR#:  [id number]  DATE OF BIRTH:  Jun 07, 1975  DATE OF ADMISSION:  04/23/2018   ADMITTING PHYSICIAN: Alford Highland, MD  DATE OF DISCHARGE:  04/28/2018  PRIMARY CARE PHYSICIAN: Patient, No Pcp Per   ADMISSION DIAGNOSIS:   Multiple sclerosis (HCC) [G35]  DISCHARGE DIAGNOSIS:   Active Problems:   Multiple sclerosis (HCC)   SECONDARY DIAGNOSIS:   Past Medical History:  Diagnosis Date  . MS (multiple sclerosis) [facility])     HOSPITAL COURSE:   43 year old male with past medical history significant for ongoing smoking, relapsing remitting multiple sclerosis not on any maintenance medications presents to hospital secondary to worsening left-sided weakness.  1.  Multiple sclerosis exacerbation-MRI of the brain and also C-spine indicating active demyelination and advanced white matter changes consistent with demyelinating disease. -No evidence of any stroke.  CSF with oligoclonal bands. -Appreciate neurology consult.   No infection identified.  JC virus test is pending -Received 5 days of IV Solu-Medrol high-dose, With improvement -Outpatient follow-up with neurology needed to start on disease modifying agents for MS -Patient is ambulating better today.  Refused to go to CIR, will be discharged home today  2.  Low B12 levels-continue to replace orally Also low vitamin D levels.  50,000 units given here, continue oral supplements daily  3.  Depression-on Wellbutrin  4.  Tobacco use disorder-strongly counseled  5.  GERD symptoms-secondary to steroids.  Can take over-the-counter Pepcid or Prilosec as needed Resolved now  We discharged home with home health physical therapy today.   DISCHARGE CONDITIONS:   Guarded  CONSULTS OBTAINED:   Treatment Team:  Kym Groom, MD  DRUG ALLERGIES:   Allergies  Allergen Reactions  . Penicillins    DISCHARGE MEDICATIONS:    Allergies as of 04/28/2018      Reactions   Penicillins       Medication List    TAKE these medications   buPROPion 150 MG 24 hr tablet Commonly known as:  WELLBUTRIN XL Take 1 tablet (150 mg total) by mouth every morning.   cholecalciferol 25 MCG (1000 UT) tablet Commonly known as:  VITAMIN D3 Take 2 tablets (2,000 Units total) by mouth daily. Start taking on:  April 29, 2018   cyanocobalamin 1000 MCG tablet Take 1 tablet (1,000 mcg total) by mouth daily. Start taking on:  April 29, 2018   methocarbamol 750 MG tablet Commonly known as:  ROBAXIN Take 1 tablet (750 mg total) by mouth every 8 (eight) hours as needed for muscle spasms.            Durable Medical Equipment  (From admission, onward)         Start     Ordered   04/28/18 1012  For home use only DME Walker rolling  Once    Question:  Patient needs a walker to treat with the following condition  Answer:  Multiple sclerosis (HCC)   04/28/18 1012           DISCHARGE INSTRUCTIONS:   1.  PCP follow-up in 1 to 2 weeks 2.  Neurology follow-up in 2 weeks  DIET:   Regular diet  ACTIVITY:   Activity as tolerated  OXYGEN:   Home Oxygen:  No Oxygen Delivery: room air  DISCHARGE LOCATION:   home   If you experience worsening of your admission symptoms, develop shortness of breath, life threatening emergency, suicidal or homicidal thoughts  you must seek medical attention immediately by calling 911 or calling your MD immediately  if symptoms less severe.  You Must read complete instructions/literature along with all the possible adverse reactions/side effects for all the Medicines you take and that have been prescribed to you. Take any new Medicines after you have completely understood and accpet all the possible adverse reactions/side effects.   Please note  You were cared for by a hospitalist during your hospital stay. If you have any questions about your discharge medications or the care you  received while you were in the hospital after you are discharged, you can call the unit and asked to speak with the hospitalist on call if the hospitalist that took care of you is not available. Once you are discharged, your primary care physician will handle any further medical issues. Please note that NO REFILLS for any discharge medications will be authorized once you are discharged, as it is imperative that you return to your primary care physician (or establish a relationship with a primary care physician if you do not have one) for your aftercare needs so that they can reassess your need for medications and monitor your lab values.    On the day of Discharge:  VITAL SIGNS:   Blood pressure (!) 128/94, pulse 61, temperature 97.9 F (36.6 C), temperature source Oral, resp. rate 15, height 6\' 1"  (1.854 m), weight 111.1 kg, SpO2 100 %.  PHYSICAL EXAMINATION:     GENERAL:  43 y.o.-year-old patient lying in the bed with no acute distress.  EYES: Pupils equal, round, reactive to light and accommodation. No scleral icterus. Extraocular muscles intact.  HEENT: Head atraumatic, normocephalic. Oropharynx and nasopharynx clear.  NECK:  Supple, no jugular venous distention. No thyroid enlargement, no tenderness.  LUNGS: Normal breath sounds bilaterally, no wheezing, rales,rhonchi or crepitation. No use of accessory muscles of respiration.  CARDIOVASCULAR: S1, S2 normal. No murmurs, rubs, or gallops.  ABDOMEN: Soft, nontender, nondistended. Bowel sounds present. No organomegaly or mass.  EXTREMITIES: No pedal edema, cyanosis, or clubbing.  NEUROLOGIC: Cranial nerves II through XII are intact.  Right arm strength is 5/5, right lower extremity strength is 5/5 except mild foot drop noted.  Left upper and lower extremity strength is 4/5.  Sensation intact. Gait not checked.  PSYCHIATRIC: The patient is alert and oriented x 3.  SKIN: No obvious rash, lesion, or ulcer.  DATA REVIEW:   CBC Recent Labs   Lab 04/24/18 0451  WBC 11.1*  HGB 16.3  HCT 48.8  PLT 327    Chemistries  Recent Labs  Lab 04/23/18 1240  04/26/18 0521  NA 140   < > 140  K 4.3   < > 4.5  CL 107   < > 107  CO2 27   < > 26  GLUCOSE 82   < > 125*  BUN 13   < > 22*  CREATININE 0.86   < > 0.89  CALCIUM 9.1   < > 8.9  AST 19  --   --   ALT 17  --   --   ALKPHOS 45  --   --   BILITOT 0.7  --   --    < > = values in this interval not displayed.     Microbiology Results  No results found for this or any previous visit.  RADIOLOGY:  No results found.   Management plans discussed with the patient, family and they are in agreement.  CODE STATUS:  Code Status Orders  (From admission, onward)         Start     Ordered   04/23/18 1501  Full code  Continuous     04/23/18 1501        Code Status History    This patient has a current code status but no historical code status.      TOTAL TIME TAKING CARE OF THIS PATIENT: 38 minutes.    Enid Baas M.D on 04/28/2018 at 10:12 AM  Between 7am to 6pm - Pager - [phone]  After 6pm go to [email or web address] - Social research officer, government  [employer] [facility]  Hospitalists  Office  [phone]  CC: Primary care physician; Patient, No Pcp Per   Note: This dictation was prepared with Dragon dictation along with smaller phrase technology. Any transcriptional errors that result from this process are unintentional.

## 2018-04-29 LAB — CBC WITH DIFFERENTIAL/PLATELET
Abs Immature Granulocytes: 0.24 10*3/uL — ABNORMAL HIGH (ref 0.00–0.07)
BASOS ABS: 0 10*3/uL (ref 0.0–0.1)
Basophils Relative: 0 %
Eosinophils Absolute: 0 10*3/uL (ref 0.0–0.5)
Eosinophils Relative: 0 %
HEMATOCRIT: 44.3 % (ref 39.0–52.0)
Hemoglobin: 14.9 g/dL (ref 13.0–17.0)
Immature Granulocytes: 2 %
LYMPHS ABS: 1.5 10*3/uL (ref 0.7–4.0)
LYMPHS PCT: 9 %
MCH: 31.2 pg (ref 26.0–34.0)
MCHC: 33.6 g/dL (ref 30.0–36.0)
MCV: 92.9 fL (ref 80.0–100.0)
Monocytes Absolute: 1.3 10*3/uL — ABNORMAL HIGH (ref 0.1–1.0)
Monocytes Relative: 8 %
Neutro Abs: 12.8 10*3/uL — ABNORMAL HIGH (ref 1.7–7.7)
Neutrophils Relative %: 81 %
Platelets: 291 10*3/uL (ref 150–400)
RBC: 4.77 MIL/uL (ref 4.22–5.81)
RDW: 13.5 % (ref 11.5–15.5)
WBC: 15.8 10*3/uL — AB (ref 4.0–10.5)
nRBC: 0 % (ref 0.0–0.2)

## 2018-04-29 LAB — BASIC METABOLIC PANEL
Anion gap: 7 (ref 5–15)
BUN: 24 mg/dL — ABNORMAL HIGH (ref 6–20)
CO2: 23 mmol/L (ref 22–32)
CREATININE: 0.9 mg/dL (ref 0.61–1.24)
Calcium: 8.5 mg/dL — ABNORMAL LOW (ref 8.9–10.3)
Chloride: 109 mmol/L (ref 98–111)
GFR calc Af Amer: >60 mL/min (ref >60)
GFR calc non Af Amer: >60 mL/min (ref >60)
Glucose, Bld: 183 mg/dL — ABNORMAL HIGH (ref 70–99)
Potassium: 3.6 mmol/L (ref 3.5–5.1)
Sodium: 139 mmol/L (ref 135–145)

## 2018-04-29 MED ORDER — KETOROLAC TROMETHAMINE 30 MG/ML IJ SOLN
30.0000 mg | Freq: Once | INTRAMUSCULAR | Status: AC
  Administered 2018-04-29: 30 mg via INTRAVENOUS
  Filled 2018-04-29: qty 1

## 2018-04-29 NOTE — Progress Notes (Signed)
Inpatient Rehabilitation Admissions Coordinator  I was contacted by Robbie Lis with Case Management that patient discharged yesterday but returned to the ED requesting rehab at this time. Yesterday prior to d/c, I had spoken with patient about the possibility of an inpt rehab admit. Pt declined and wished to d/c home with his girlfriend. OT assessed pt at bedside 3/24 with patient assessed CGA with FWW for short distances from bed to chair and to sink. DME was recommended by OT . HH was recommended. I have attempted to contact pt's girlfriend by phone without success. If patient unable to d/c home with girlfriend and home health at this time, recommend SNF rehab. I have discussed with [place] my recommendations.  Ottie Glazier, RN, MSN Rehab Admissions Coordinator [phone] 04/29/2018 9:39 AM

## 2018-04-29 NOTE — ED Provider Notes (Signed)
Patient was evaluated by case management and has been cleared for outpatient follow-up.   Emily Filbert, MD 04/29/18 1001

## 2018-04-29 NOTE — ED Notes (Signed)
Pt states "I feel a lot better now, nothing hurts". This RN will continue to monitor to pt

## 2018-04-29 NOTE — ED Notes (Signed)
This RN noticed that each time pt falls asleep his O2sat and HR drop, VS rise back to normal range when awaken.

## 2018-04-29 NOTE — ED Notes (Signed)
Pt provided with a beverage. 

## 2018-04-29 NOTE — Care Management (Signed)
Brad from [employer] called and alerted me that he attempted on many occasions to call the patient and his Contact person to deliver the equipment and was unable to reach them and noone called back after multiple voice mails.

## 2018-04-29 NOTE — ED Notes (Signed)
Pt provided with a meal tray and a beverage.

## 2018-04-29 NOTE — ED Notes (Signed)
Case manager at bedside 

## 2018-04-29 NOTE — TOC Initial Note (Signed)
Transition of Care (TOC) - Initial/Assessment Note    Patient Details  Name: Evan Reynolds. MRN: [id number] Date of Birth: Jun 05, 1975  Transition of Care [facility]) CM/SW Contact:    Allayne Butcher, RN Phone Number: 04/29/2018, 10:52 AM  Clinical Narrative:                 Patient is being seen in the ED for MS exacerbation.  Patient was just discharged yesterday and returned to the ED last night.  Patient is looking well this morning and reports he feels great.  Patient is ready to go home but he does not have a ride.  No medical need for admission.  Patient reports that he lives with his girlfriend Lupita Leash and 2 other roommates.  Patient reports they are all disabled and none of them drive but they are able to help each other when needed.  Patient does not have a PCP and recently moved to [facility] from [facility] where he was homeless.  Patient given resources for transportation and one bus pass for [employer].  Patient was also given a list of local community clinics and instructed to call and make an appointment with a clinic of his choice.  Patient verbalizes understanding.     Expected Discharge Plan: Home/Self Care     Patient Goals and CMS Choice        Expected Discharge Plan and Services Expected Discharge Plan: Home/Self Care   Discharge Planning Services: CM Consult   Living arrangements for the past 2 months: Single Family Home                   DME Agency: [employer]      Prior Living Arrangements/Services Living arrangements for the past 2 months: Single Family Home Lives with:: Significant Other, Friends Patient language and need for interpreter reviewed:: No Do you feel safe going back to the place where you live?: Yes      Need for Family Participation in Patient Care: Yes (Comment)(patient has a girlfriend and roommates) Care giver support system in place?: Yes (comment) Current home services: DME Criminal Activity/Legal Involvement Pertinent to Current  Situation/Hospitalization: No - Comment as needed  Activities of Daily Living      Permission Sought/Granted Permission sought to share information with : Case Manager                Emotional Assessment Appearance:: Appears older than stated age, Gerrit Heck, Malodorous Attitude/Demeanor/Rapport: Engaged Affect (typically observed): Accepting Orientation: : Oriented to Self, Oriented to Place, Oriented to  Time, Oriented to Situation Alcohol / Substance Use: Tobacco Use Psych Involvement: No (comment)  Admission diagnosis:  [facility] - MS Flare Patient Active Problem List   Diagnosis Date Noted  . Multiple sclerosis (HCC) 04/23/2018   PCP:  Patient, No Pcp Per Pharmacy:   [facility] [address] [place] - [address] [place] [address] Phone: [phone] Fax: [phone]     Social Determinants of Health (SDOH) Interventions    Readmission Risk Interventions No flowsheet data found.

## 2018-04-29 NOTE — ED Notes (Signed)
Patient denies pain and is resting comfortably.  

## 2018-04-29 NOTE — TOC Transition Note (Signed)
Transition of Care [facility]) - CM/SW Discharge Note   Patient Details  Name: Evan Reynolds. MRN: [id number] Date of Birth: 08-31-1975  Transition of Care [facility]) CM/SW Contact:  Allayne Butcher, RN Phone Number: 04/29/2018, 10:56 AM   Clinical Narrative:     Patient discharged home cab voucher, voucher utilized because patient has no one to pick him up.  Brad with [employer] notified that patient is discharging home and he will reach out to deliver equipment that was ordered at his last admission.    Final next level of care: Home/Self Care Barriers to Discharge: No Barriers Identified   Patient Goals and CMS Choice Patient states their goals for this hospitalization and ongoing recovery are:: Ready to go home      Discharge Placement                       Discharge Plan and Services   Discharge Planning Services: CM Consult              DME Agency: [employer]       Social Determinants of Health (SDOH) Interventions     Readmission Risk Interventions No flowsheet data found.

## 2018-04-29 NOTE — ED Provider Notes (Signed)
[facility] Emergency Department Provider Note  ____________________________________________   I have reviewed the triage vital signs and the nursing notes.   HISTORY  Chief Complaint Jerking  History limited by: Not Limited   HPI Evan Kauk. is a 43 y.o. male who presents to the emergency department today because of concerns for body jerking.  Patient states that he took a nap today.  States when he woke up from his nap his whole body was jerking.  He was awake and alert when this was occurring. He states that he has also been weak for about a week. Has been having a difficult time walking. In addition he feels like his bilateral arms are tight. States he has MS but does not have a doctor in the area because of recent move. Denies any fevers. Denies any trauma to his head.    Records reviewed. Per medical record review patient has a history of MS, hospitalization with discharge yesterday for MS flare.   Past Medical History:  Diagnosis Date  . MS (multiple sclerosis) [facility])     Patient Active Problem List   Diagnosis Date Noted  . Multiple sclerosis (HCC) 04/23/2018    Past Surgical History:  Procedure Laterality Date  . APPENDECTOMY    . arm surgery    . CHOLECYSTECTOMY      Prior to Admission medications   Medication Sig Start Date End Date Taking? Authorizing Provider  buPROPion (WELLBUTRIN XL) 150 MG 24 hr tablet Take 1 tablet (150 mg total) by mouth every morning. 04/28/18   Enid Baas, MD  cholecalciferol (VITAMIN D3) 25 MCG (1000 UT) tablet Take 2 tablets (2,000 Units total) by mouth daily. 04/29/18   Enid Baas, MD  methocarbamol (ROBAXIN) 750 MG tablet Take 1 tablet (750 mg total) by mouth every 8 (eight) hours as needed for muscle spasms. 04/28/18   Enid Baas, MD  vitamin B-12 1000 MCG tablet Take 1 tablet (1,000 mcg total) by mouth daily. 04/29/18   Enid Baas, MD    Allergies Penicillins  Family  History  Problem Relation Age of Onset  . Thyroid disease Mother   . Diabetes Father   . CAD Father     Social History Social History   Tobacco Use  . Smoking status: Current Some Day Smoker    Years: 1.00  . Smokeless tobacco: Never Used  Substance Use Topics  . Alcohol use: Not Currently  . Drug use: Yes    Types: Marijuana    Review of Systems Constitutional: No fever/chills Eyes: No visual changes. ENT: No sore throat. Cardiovascular: Denies chest pain. Respiratory: Denies shortness of breath. Gastrointestinal: No abdominal pain.  No nausea, no vomiting.  No diarrhea.   Genitourinary: Negative for dysuria. Musculoskeletal: Positive for bilateral arm pain. Skin: Negative for rash. Neurological: Positive for weakness. Positive for jerking.  ____________________________________________   PHYSICAL EXAM:  VITAL SIGNS: ED Triage Vitals  Enc Vitals Group     BP 04/28/18 2350 (!) 143/89     Pulse Rate 04/28/18 2350 82     Resp 04/28/18 2350 17     Temp 04/28/18 2350 97.6 F (36.4 C)     Temp Source 04/28/18 2350 Oral     SpO2 04/28/18 2350 100 %     Weight 04/28/18 2353 245 lb (111.1 kg)     Height 04/28/18 2353 6\' 1"  (1.854 m)     Head Circumference --      Peak Flow --  Pain Score 04/28/18 2351 9   Constitutional: Alert and oriented. Not confused.  Eyes: Conjunctivae are normal.  ENT      Head: Normocephalic and atraumatic.      Nose: No congestion/rhinnorhea.      Mouth/Throat: Mucous membranes are moist.      Neck: No stridor. Hematological/Lymphatic/Immunilogical: No cervical lymphadenopathy. Cardiovascular: Normal rate, regular rhythm.  No murmurs, rubs, or gallops. Respiratory: Normal respiratory effort without tachypnea nor retractions. Breath sounds are clear and equal bilaterally. No wheezes/rales/rhonchi. Gastrointestinal: Soft and non tender. No rebound. No guarding.  Genitourinary: Deferred Musculoskeletal: Normal range of motion in all  extremities. No lower extremity edema. Neurologic:  Normal speech and language. No gross focal neurologic deficits are appreciated.  Skin:  Skin is warm, dry and intact. No rash noted. Psychiatric: Mood and affect are normal. Speech and behavior are normal. Patient exhibits appropriate insight and judgment.  ____________________________________________    LABS (pertinent positives/negatives)  CBC wbc 15.8, hgb 14.9, plt 291 BMP na 139, k 3.6, cl 109, glu 183, cr 0.90  ____________________________________________   EKG  I, Phineas Semen, attending physician, personally viewed and interpreted this EKG  EKG Time: 2353 Rate: 85 Rhythm: sinus rhythm Axis: normal Intervals: qtc 434 QRS: narrow, q waves v1, v2 ST changes: no st elevation Impression: abnromal ekg   ____________________________________________    RADIOLOGY  None  ____________________________________________   PROCEDURES  Procedures  ____________________________________________   INITIAL IMPRESSION / ASSESSMENT AND PLAN / ED COURSE  Pertinent labs & imaging results that were available during my care of the patient were reviewed by me and considered in my medical decision making (see chart for details).   Patient presented to the emergency department today because of complaints of some jerking as well as continued weakness.  On exam patient is awake and alert.  Does not appear postictal.  No seizure-like activity appreciated.  Patient had just been discharged from the hospital yesterday.  Patient does state that he would now would like to go to rehab.  Per chart review it appears that patient has been offered rehab which he declined.  Discussed with neurology on-call about jerking episode. Unclear at this point if true seizure. Given recent neuroimaging and lack of trauma do not feel any emergent re imaging is necessary. Did offer to start patient on anti seizure however he declined. Will have case manager  evaluate in the morning for possible rehab placement.    ____________________________________________   FINAL CLINICAL IMPRESSION(S) / ED DIAGNOSES  Final diagnoses:  Multiple sclerosis (HCC)     Note: This dictation was prepared with Dragon dictation. Any transcriptional errors that result from this process are unintentional     Phineas Semen, MD 04/29/18 [phone]

## 2018-04-29 NOTE — ED Notes (Signed)
This RN contacted taxy services, Cheyenne Adas; ETA 40 minutes.

## 2018-08-19 ENCOUNTER — Inpatient Hospital Stay: Payer: Medicare Other

## 2018-08-19 ENCOUNTER — Inpatient Hospital Stay
Admission: EM | Admit: 2018-08-19 | Discharge: 2018-08-21 | DRG: 060 | Disposition: A | Discharge status: Home / Self Care | Payer: Medicare Other | Attending: Internal Medicine | Admitting: Internal Medicine

## 2018-08-19 ENCOUNTER — Other Ambulatory Visit: Payer: Self-pay

## 2018-08-19 DIAGNOSIS — Z8249 Family history of ischemic heart disease and other diseases of the circulatory system: Secondary | ICD-10-CM

## 2018-08-19 DIAGNOSIS — Z9049 Acquired absence of other specified parts of digestive tract: Secondary | ICD-10-CM | POA: Diagnosis not present

## 2018-08-19 DIAGNOSIS — Z6830 Body mass index (BMI) 30.0-30.9, adult: Secondary | ICD-10-CM | POA: Diagnosis not present

## 2018-08-19 DIAGNOSIS — G35 Multiple sclerosis: Principal | ICD-10-CM | POA: Diagnosis present

## 2018-08-19 DIAGNOSIS — F329 Major depressive disorder, single episode, unspecified: Secondary | ICD-10-CM | POA: Diagnosis present

## 2018-08-19 DIAGNOSIS — E559 Vitamin D deficiency, unspecified: Secondary | ICD-10-CM | POA: Diagnosis present

## 2018-08-19 DIAGNOSIS — W19XXXA Unspecified fall, initial encounter: Secondary | ICD-10-CM | POA: Diagnosis present

## 2018-08-19 DIAGNOSIS — Z9119 Patient's noncompliance with other medical treatment and regimen: Secondary | ICD-10-CM

## 2018-08-19 DIAGNOSIS — F17200 Nicotine dependence, unspecified, uncomplicated: Secondary | ICD-10-CM | POA: Diagnosis present

## 2018-08-19 DIAGNOSIS — S50312A Abrasion of left elbow, initial encounter: Secondary | ICD-10-CM | POA: Diagnosis present

## 2018-08-19 DIAGNOSIS — Z1159 Encounter for screening for other viral diseases: Secondary | ICD-10-CM

## 2018-08-19 DIAGNOSIS — Z8349 Family history of other endocrine, nutritional and metabolic diseases: Secondary | ICD-10-CM | POA: Diagnosis not present

## 2018-08-19 DIAGNOSIS — Z880 Allergy status to penicillin: Secondary | ICD-10-CM | POA: Diagnosis not present

## 2018-08-19 DIAGNOSIS — N39490 Overflow incontinence: Secondary | ICD-10-CM | POA: Diagnosis present

## 2018-08-19 DIAGNOSIS — E663 Overweight: Secondary | ICD-10-CM | POA: Diagnosis present

## 2018-08-19 DIAGNOSIS — E538 Deficiency of other specified B group vitamins: Secondary | ICD-10-CM | POA: Diagnosis present

## 2018-08-19 DIAGNOSIS — Z833 Family history of diabetes mellitus: Secondary | ICD-10-CM

## 2018-08-19 LAB — COMPREHENSIVE METABOLIC PANEL
ALT: 13 U/L (ref 0–44)
AST: 12 U/L — ABNORMAL LOW (ref 15–41)
Albumin: 4 g/dL (ref 3.5–5.0)
Alkaline Phosphatase: 54 U/L (ref 38–126)
Anion gap: 6 (ref 5–15)
BUN: 15 mg/dL (ref 6–20)
CO2: 29 mmol/L (ref 22–32)
Calcium: 8.7 mg/dL — ABNORMAL LOW (ref 8.9–10.3)
Chloride: 107 mmol/L (ref 98–111)
Creatinine, Ser: 1.08 mg/dL (ref 0.61–1.24)
GFR calc Af Amer: >60 mL/min (ref >60)
GFR calc non Af Amer: >60 mL/min (ref >60)
Glucose, Bld: 92 mg/dL (ref 70–99)
Potassium: 4.1 mmol/L (ref 3.5–5.1)
Sodium: 142 mmol/L (ref 135–145)
Total Bilirubin: 0.6 mg/dL (ref 0.3–1.2)
Total Protein: 6.9 g/dL (ref 6.5–8.1)

## 2018-08-19 LAB — URINALYSIS, ROUTINE W REFLEX MICROSCOPIC
Bilirubin Urine: NEGATIVE
Glucose, UA: NEGATIVE mg/dL
Hgb urine dipstick: NEGATIVE
Ketones, ur: NEGATIVE mg/dL
Leukocytes,Ua: NEGATIVE
Nitrite: NEGATIVE
Protein, ur: NEGATIVE mg/dL
Specific Gravity, Urine: 1.032 — ABNORMAL HIGH (ref 1.005–1.030)
pH: 5 (ref 5.0–8.0)

## 2018-08-19 LAB — CBC WITH DIFFERENTIAL/PLATELET
Abs Immature Granulocytes: 0.03 10*3/uL (ref 0.00–0.07)
Basophils Absolute: 0.1 10*3/uL (ref 0.0–0.1)
Basophils Relative: 1 %
Eosinophils Absolute: 0.3 10*3/uL (ref 0.0–0.5)
Eosinophils Relative: 3 %
HCT: 47.1 % (ref 39.0–52.0)
Hemoglobin: 15.6 g/dL (ref 13.0–17.0)
Immature Granulocytes: 0 %
Lymphocytes Relative: 36 %
Lymphs Abs: 3.4 10*3/uL (ref 0.7–4.0)
MCH: 31.1 pg (ref 26.0–34.0)
MCHC: 33.1 g/dL (ref 30.0–36.0)
MCV: 94 fL (ref 80.0–100.0)
Monocytes Absolute: 1.1 10*3/uL — ABNORMAL HIGH (ref 0.1–1.0)
Monocytes Relative: 12 %
Neutro Abs: 4.5 10*3/uL (ref 1.7–7.7)
Neutrophils Relative %: 48 %
Platelets: 326 10*3/uL (ref 150–400)
RBC: 5.01 MIL/uL (ref 4.22–5.81)
RDW: 13.5 % (ref 11.5–15.5)
WBC: 9.5 10*3/uL (ref 4.0–10.5)
nRBC: 0 % (ref 0.0–0.2)

## 2018-08-19 LAB — TSH: TSH: 2.763 u[IU]/mL (ref 0.350–4.500)

## 2018-08-19 LAB — VITAMIN B12: Vitamin B-12: 174 pg/mL — ABNORMAL LOW (ref 180–914)

## 2018-08-19 LAB — SARS CORONAVIRUS 2 BY RT PCR (HOSPITAL ORDER, PERFORMED IN ~~LOC~~ HOSPITAL LAB): SARS Coronavirus 2: NEGATIVE

## 2018-08-19 MED ORDER — ACETAMINOPHEN 650 MG RE SUPP: 650.0000 mg | Freq: Four times a day (QID) | RECTAL | Status: DC | PRN

## 2018-08-19 MED ORDER — ONDANSETRON HCL 4 MG/2ML IJ SOLN
4.0000 mg | INTRAMUSCULAR | Status: AC
  Administered 2018-08-19: 4 mg via INTRAVENOUS
  Filled 2018-08-19: qty 2

## 2018-08-19 MED ORDER — GADOBUTROL 1 MMOL/ML IV SOLN
10.0000 mL | Freq: Once | INTRAVENOUS | Status: AC | PRN
  Administered 2018-08-19: 10 mL via INTRAVENOUS

## 2018-08-19 MED ORDER — BACLOFEN 10 MG PO TABS
10.0000 mg | ORAL_TABLET | Freq: Two times a day (BID) | ORAL | Status: DC
  Administered 2018-08-19 – 2018-08-21 (×5): 10 mg via ORAL
  Filled 2018-08-19 (×6): qty 1

## 2018-08-19 MED ORDER — DOCUSATE SODIUM 100 MG PO CAPS
100.0000 mg | ORAL_CAPSULE | Freq: Two times a day (BID) | ORAL | Status: DC
  Administered 2018-08-19 – 2018-08-20 (×3): 100 mg via ORAL
  Filled 2018-08-19 (×4): qty 1

## 2018-08-19 MED ORDER — ACETAMINOPHEN 325 MG PO TABS
650.0000 mg | ORAL_TABLET | Freq: Four times a day (QID) | ORAL | Status: DC | PRN
  Administered 2018-08-19 – 2018-08-21 (×2): 650 mg via ORAL
  Filled 2018-08-19 (×2): qty 2

## 2018-08-19 MED ORDER — SODIUM CHLORIDE 0.9 % IV SOLN
1000.0000 mg | Freq: Every day | INTRAVENOUS | Status: DC
  Filled 2018-08-19 (×2): qty 8

## 2018-08-19 MED ORDER — ONDANSETRON HCL 4 MG/2ML IJ SOLN: 4.0000 mg | Freq: Four times a day (QID) | INTRAMUSCULAR | Status: DC | PRN

## 2018-08-19 MED ORDER — ENOXAPARIN SODIUM 40 MG/0.4ML ~~LOC~~ SOLN
40.0000 mg | SUBCUTANEOUS | Status: DC
  Administered 2018-08-19 – 2018-08-21 (×3): 40 mg via SUBCUTANEOUS
  Filled 2018-08-19 (×3): qty 0.4

## 2018-08-19 MED ORDER — SODIUM CHLORIDE 0.9 % IV SOLN
1000.0000 mg | Freq: Once | INTRAVENOUS | Status: AC
  Administered 2018-08-19: 03:00:00 1000 mg via INTRAVENOUS
  Filled 2018-08-19: qty 8

## 2018-08-19 MED ORDER — NICOTINE 21 MG/24HR TD PT24
21.0000 mg | MEDICATED_PATCH | Freq: Every day | TRANSDERMAL | Status: DC
  Administered 2018-08-19 – 2018-08-21 (×3): 21 mg via TRANSDERMAL
  Filled 2018-08-19 (×4): qty 1

## 2018-08-19 MED ORDER — MORPHINE SULFATE (PF) 4 MG/ML IV SOLN
4.0000 mg | Freq: Once | INTRAVENOUS | Status: AC
  Administered 2018-08-19: 4 mg via INTRAVENOUS
  Filled 2018-08-19: qty 1

## 2018-08-19 MED ORDER — ONDANSETRON HCL 4 MG PO TABS: 4.0000 mg | ORAL_TABLET | Freq: Four times a day (QID) | ORAL | Status: DC | PRN

## 2018-08-19 NOTE — Evaluation (Signed)
Physical Therapy Evaluation Patient Details Name: Evan Reynolds. MRN: [id number] DOB: January 06, 1976 Today's Date: 08/19/2018   History of Present Illness  Patient is a 43 year old male admitted with MS exacerbation. Reports LE weakness that led to leg buckling and fall. Imaging shows small remote infacts in cerebrum.    Clinical Impression  Patient received sleeping in bed initially, agrees to PT evaluation. Reports LE decreased sensation and weakness L>R. Patient initially required assistance with L LE in bed, 2nd visit he did not. Patient was too fearful to attempt standing or walking initially with just +1 assist, therefore returned later with +2 assist for our of bed mobility. Patient required +2 min assist with sit to stand and bed elevated. He was able to walk 30 feet with RW and min guard +2 assist. Slight buckling of LEs toward end of ambulation due to fatigue. Patient will benefit from continued PT to address his weakness, decreased ambulation, decreased activity tolerance.        Follow Up Recommendations Home health PT    Equipment Recommendations  Rolling walker with 5" wheels    Recommendations for Other Services       Precautions / Restrictions Precautions Precautions: Fall Restrictions Weight Bearing Restrictions: No      Mobility  Bed Mobility Overal bed mobility: Modified Independent             General bed mobility comments: increased time needed, use of rails  Transfers Overall transfer level: Needs assistance Equipment used: Rolling walker (2 wheeled) Transfers: Sit to/from Stand Sit to Stand: Min assist;+2 physical assistance;From elevated surface            Ambulation/Gait Ambulation/Gait assistance: Min guard;+2 physical assistance Gait Distance (Feet): 30 Feet Assistive device: Rolling walker (2 wheeled) Gait Pattern/deviations: Step-to pattern;Decreased step length - right;Decreased step length - left;Decreased stride length Gait  velocity: decreased   General Gait Details: slight buckling of LEs at end of session due to fatigue, heavy use of UEs on AD.  Stairs            Wheelchair Mobility    Modified Rankin (Stroke Patients Only)       Balance Overall balance assessment: Modified Independent;Needs assistance Sitting-balance support: No upper extremity supported;Feet supported Sitting balance-Leahy Scale: Good     Standing balance support: Bilateral upper extremity supported Standing balance-Leahy Scale: Fair                               Pertinent Vitals/Pain Pain Assessment: Faces Faces Pain Scale: Hurts little more Pain Descriptors / Indicators: Aching;Discomfort Pain Intervention(s): Limited activity within patient's tolerance;Monitored during session    Home Living Family/patient expects to be discharged to:: Private residence Living Arrangements: Non-relatives/Friends Available Help at Discharge: Friend(s) Type of Home: House       Home Layout: One level Home Equipment: None      Prior Function Level of Independence: Independent         Comments: on disability, does not drive, but otherwise completes all BADL ind     Hand Dominance        Extremity/Trunk Assessment   Upper Extremity Assessment Upper Extremity Assessment: LUE deficits/detail LUE Deficits / Details: weakness LUE Sensation: decreased light touch LUE Coordination: decreased fine motor;decreased gross motor    Lower Extremity Assessment Lower Extremity Assessment: LLE deficits/detail;RLE deficits/detail RLE Deficits / Details: decreased sensation from mid shin to foot, weakness RLE Sensation: decreased  light touch RLE Coordination: decreased fine motor;decreased gross motor LLE Deficits / Details: decreased sensation lower leg to foot, weakness LLE Sensation: decreased light touch LLE Coordination: decreased fine motor;decreased gross motor       Communication   Communication: No  difficulties  Cognition Arousal/Alertness: Awake/alert Behavior During Therapy: WFL for tasks assessed/performed Overall Cognitive Status: Within Functional Limits for tasks assessed                                        General Comments      Exercises     Assessment/Plan    PT Assessment Patient needs continued PT services  PT Problem List Decreased strength;Decreased mobility;Decreased activity tolerance;Decreased balance;Pain;Decreased knowledge of use of DME       PT Treatment Interventions DME instruction;Therapeutic activities;Gait training;Therapeutic exercise;Functional mobility training;Patient/family education;Balance training    PT Goals (Current goals can be found in the Care Plan section)  Acute Rehab PT Goals Patient Stated Goal: to leave tomorrow with friend PT Goal Formulation: With patient Time For Goal Achievement: 08/26/18 Potential to Achieve Goals: Good    Frequency Min 2X/week   Barriers to discharge Other (comment) homeless, staying with friend when discharged    Co-evaluation               AM-PAC PT "6 Clicks" Mobility  Outcome Measure Help needed turning from your back to your side while in a flat bed without using bedrails?: A Little Help needed moving from lying on your back to sitting on the side of a flat bed without using bedrails?: A Little Help needed moving to and from a bed to a chair (including a wheelchair)?: A Little Help needed standing up from a chair using your arms (e.g., wheelchair or bedside chair)?: A Little Help needed to walk in hospital room?: A Little Help needed climbing 3-5 steps with a railing? : A Lot 6 Click Score: 17    End of Session Equipment Utilized During Treatment: Gait belt Activity Tolerance: Patient limited by fatigue;Patient limited by pain Patient left: in bed;with bed alarm set;with call bell/phone within reach;with SCD's reapplied Nurse Communication: Mobility status PT Visit  Diagnosis: Muscle weakness (generalized) (M62.81);Difficulty in walking, not elsewhere classified (R26.2);Pain;History of falling (Z91.81) Pain - Right/Left: (bilateral L >R) Pain - part of body: Leg    Time: 1315(saw patient at two separate times for a total of 35 min)-1415 PT Time Calculation (min) (ACUTE ONLY): 60 min   Charges:   PT Evaluation $PT Eval Moderate Complexity: 1 Mod PT Treatments $Gait Training: 23-37 mins        Pharrell Ledford, PT, GCS 08/19/18,2:25 PM

## 2018-08-19 NOTE — TOC Initial Note (Signed)
Transition of Care (TOC) - Initial/Assessment Note    Patient Details  Name: Evan Reynolds. MRN: [id number] Date of Birth: [birth year]  Transition of Care [facility]) CM/SW Contact:    Su Hilt, RN Phone Number: 08/19/2018, 2:52 PM  Clinical Narrative:                 Met with the patient to discuss DC plan and needs He has lived with friends and family recently and will be leaving the hospital with his Enrigue Catena and going to [place] to stay He needs a RW, I notified Brad with [employer]  He can get his medications with no problem He was provided with [employer] to include the housing athority, I explained to him that he would need to apply for these services and that there may be a waiting list.  He stated understanding  He was also provided with the purple resource booklet  He has no need for [facility] services  Expected Discharge Plan: Home/Self Care Barriers to Discharge: Barriers Resolved   Patient Goals and CMS Choice Patient states their goals for this hospitalization and ongoing recovery are:: go home with friend in [name]      Expected Discharge Plan and Services Expected Discharge Plan: Home/Self Care   Discharge Planning Services: CM Consult   Living arrangements for the past 2 months: Single Family Home                 DME Arranged: Walker rolling DME Agency: [employer] Date DME Agency Contacted: 08/19/18 Time DME Agency Contacted: 13 Representative spoke with at DME Agency: Leroy [facility] HH Arranged: NA          Prior Living Arrangements/Services Living arrangements for the past 2 months: [place] Lives with:: Friends Patient language and need for interpreter reviewed:: No Do you feel safe going back to the place where you live?: Yes      Need for Family Participation in Patient Care: No (Comment) Care giver support system in place?: Yes (comment)   Criminal Activity/Legal Involvement Pertinent to Current  Situation/Hospitalization: No - Comment as needed  Activities of Daily Living Home Assistive Devices/Equipment: None ADL Screening (condition at time of admission) Patient's cognitive ability adequate to safely complete daily activities?: Yes Is the patient deaf or have difficulty hearing?: No Does the patient have difficulty seeing, even when wearing glasses/contacts?: No Does the patient have difficulty concentrating, remembering, or making decisions?: No Patient able to express need for assistance with ADLs?: Yes Does the patient have difficulty dressing or bathing?: No Independently performs ADLs?: Yes (appropriate for developmental age) Does the patient have difficulty walking or climbing stairs?: Yes Weakness of Legs: Both Weakness of Arms/Hands: None  Permission Sought/Granted                  Emotional Assessment Appearance:: Appears stated age Attitude/Demeanor/Rapport: Engaged Affect (typically observed): Accepting Orientation: : Oriented to Self, Oriented to Place, Oriented to  Time, Oriented to Situation Alcohol / Substance Use: Not Applicable Psych Involvement: No (comment)  Admission diagnosis:  Multiple sclerosis ([place]) [G35] Patient Active Problem List   Diagnosis Date Noted  . Multiple sclerosis exacerbation ([place]) 08/19/2018  . Multiple sclerosis ([place]) 04/23/2018   PCP:  Patient, No Pcp Per Pharmacy:   [facility] [address], [place] [address] Phone: [phone] Fax: [phone]     Social Determinants of Health (SDOH) Interventions    Readmission  Risk Interventions No flowsheet data found.

## 2018-08-19 NOTE — ED Notes (Signed)
ED TO INPATIENT HANDOFF REPORT  ED Nurse Name and Phone #: Vergia AlconRachel Desiray Orchard, RN [phone]  S Name/Age/Gender Evan Braunerry R Skarda Jr. 43 y.o. male Room/Bed: ED16A/ED16A  Code Status   Code Status: Prior  Home/SNF/Other Home Patient oriented to: self, place, time and situation Is this baseline? Yes   Triage Complete: Triage complete  Chief Complaint [facility] - Leg weakness  Triage Note Pt to ED via EMS. Pt was walking down road when his legs gave out, pt has hx of MS. Pt takes no medication for MS. Pt states his legs have been feeling weaker recently. States he cannot feel his legs, 8/10 bilateral leg pain. VSS.   Allergies Allergies  Allergen Reactions  . Penicillins     Level of Care/Admitting Diagnosis ED Disposition    ED Disposition Condition Comment   Admit  Hospital Area: [facility] [[id number]]  Level of Care: Med-Surg [16]  Covid Evaluation: Confirmed COVID Negative  Diagnosis: Multiple sclerosis exacerbation [facility](HCC) [[id number]]) [[id number]]  Admitting Physician: Arnaldo NatalIAMOND, MICHAEL S [[id number]]  Attending Physician: Arnaldo NatalDIAMOND, MICHAEL S [[id number]]  Estimated length of stay: past midnight tomorrow  Certification:: I certify this patient will need inpatient services for at least 2 midnights  PT Class (Do Not Modify): Inpatient [101]  PT Acc Code (Do Not Modify): Private [1]       B Medical/Surgery History Past Medical History:  Diagnosis Date  . MS (multiple sclerosis) (HCC)    Past Surgical History:  Procedure Laterality Date  . APPENDECTOMY    . arm surgery    . CHOLECYSTECTOMY       A IV Location/Drains/Wounds Patient Lines/Drains/Airways Status   Active Line/Drains/Airways    None          Intake/Output Last 24 hours No intake or output data in the 24 hours ending 08/19/18 [id number]  Labs/Imaging Results for orders placed or performed during the hospital encounter of 08/19/18 (from the past 48 hour(s))  SARS Coronavirus 2 (CEPHEID - Performed in [facility] lab), Hosp Order     Status: None   Collection Time: 08/19/18 12:58 AM   Specimen: Nasopharyngeal Swab  Result Value Ref Range   SARS Coronavirus 2 NEGATIVE NEGATIVE    Comment: (NOTE) If result is NEGATIVE SARS-CoV-2 target nucleic acids are NOT DETECTED. The SARS-CoV-2 RNA is generally detectable in upper and lower  respiratory specimens during the acute phase of infection. The lowest  concentration of SARS-CoV-2 viral copies this assay can detect is 250  copies / mL. A negative result does not preclude SARS-CoV-2 infection  and should not be used as the sole basis for treatment or other  patient management decisions.  A negative result may occur with  improper specimen collection / handling, submission of specimen other  than nasopharyngeal swab, presence of viral mutation(s) within the  areas targeted by this assay, and inadequate number of viral copies  (<250 copies / mL). A negative result must be combined with clinical  observations, patient history, and epidemiological information. If result is POSITIVE SARS-CoV-2 target nucleic acids are DETECTED. The SARS-CoV-2 RNA is generally detectable in upper and lower  respiratory specimens dur ing the acute phase of infection.  Positive  results are indicative of active infection with SARS-CoV-2.  Clinical  correlation with patient history and other diagnostic information is  necessary to determine patient infection status.  Positive results do  not rule out bacterial infection or co-infection with other viruses. If result is PRESUMPTIVE POSTIVE SARS-CoV-2 nucleic acids  MAY BE PRESENT.   A presumptive positive result was obtained on the submitted specimen  and confirmed on repeat testing.  While 2019 novel coronavirus  (SARS-CoV-2) nucleic acids may be present in the submitted sample  additional confirmatory testing may be necessary for epidemiological  and / or clinical management purposes  to differentiate between   SARS-CoV-2 and other Sarbecovirus currently known to infect humans.  If clinically indicated additional testing with an alternate test  methodology [phone]) is advised. The SARS-CoV-2 RNA is generally  detectable in upper and lower respiratory sp ecimens during the acute  phase of infection. The expected result is Negative. Fact Sheet for Patients:  BoilerBrush.com.cy Fact Sheet for Healthcare Providers: https://pope.com/ This test is not yet approved or cleared by the [place] FDA and has been authorized for detection and/or diagnosis of SARS-CoV-2 by FDA under an Emergency Use Authorization (EUA).  This EUA will remain in effect (meaning this test can be used) for the duration of the COVID-19 declaration under Section 564(b)(1) of the Act, 21 U.S.C. section 360bbb-3(b)(1), unless the authorization is terminated or revoked sooner. Performed at [facility], [address] [place], [place] [address]   CBC with Differential/Platelet     Status: Abnormal   Collection Time: 08/19/18 12:58 AM  Result Value Ref Range   WBC 9.5 4.0 - 10.5 K/uL   RBC 5.01 4.22 - 5.81 MIL/uL   Hemoglobin 15.6 13.0 - 17.0 g/dL   HCT 59.5 63.8 - 75.6 %   MCV 94.0 80.0 - 100.0 fL   MCH 31.1 26.0 - 34.0 pg   MCHC 33.1 30.0 - 36.0 g/dL   RDW 43.3 29.5 - 18.8 %   Platelets 326 150 - 400 K/uL   nRBC 0.0 0.0 - 0.2 %   Neutrophils Relative % 48 %   Neutro Abs 4.5 1.7 - 7.7 K/uL   Lymphocytes Relative 36 %   Lymphs Abs 3.4 0.7 - 4.0 K/uL   Monocytes Relative 12 %   Monocytes Absolute 1.1 (H) 0.1 - 1.0 K/uL   Eosinophils Relative 3 %   Eosinophils Absolute 0.3 0.0 - 0.5 K/uL   Basophils Relative 1 %   Basophils Absolute 0.1 0.0 - 0.1 K/uL   Immature Granulocytes 0 %   Abs Immature Granulocytes 0.03 0.00 - 0.07 K/uL    Comment: Performed at [facility], [address] [place], [place] [address]  Comprehensive metabolic panel      Status: Abnormal   Collection Time: 08/19/18 12:58 AM  Result Value Ref Range   Sodium 142 135 - 145 mmol/L   Potassium 4.1 3.5 - 5.1 mmol/L   Chloride 107 98 - 111 mmol/L   CO2 29 22 - 32 mmol/L   Glucose, Bld 92 70 - 99 mg/dL   BUN 15 6 - 20 mg/dL   Creatinine, Ser 6.30 0.61 - 1.24 mg/dL   Calcium 8.7 (L) 8.9 - 10.3 mg/dL   Total Protein 6.9 6.5 - 8.1 g/dL   Albumin 4.0 3.5 - 5.0 g/dL   AST 12 (L) 15 - 41 U/L   ALT 13 0 - 44 U/L   Alkaline Phosphatase 54 38 - 126 U/L   Total Bilirubin 0.6 0.3 - 1.2 mg/dL   GFR calc non Af Amer >60 >60 mL/min   GFR calc Af Amer >60 >60 mL/min   Anion gap 6 5 - 15    Comment: Performed at [facility], [address] [place], [place] [address]  Urinalysis, Routine w reflex microscopic     Status: Abnormal   Collection Time: 08/19/18 12:58 AM  Result Value Ref Range   Color, Urine YELLOW (A) YELLOW   APPearance CLEAR (A) CLEAR   Specific Gravity, Urine 1.032 (H) 1.005 - 1.030   pH 5.0 5.0 - 8.0   Glucose, UA NEGATIVE NEGATIVE mg/dL   Hgb urine dipstick NEGATIVE NEGATIVE   Bilirubin Urine NEGATIVE NEGATIVE   Ketones, ur NEGATIVE NEGATIVE mg/dL   Protein, ur NEGATIVE NEGATIVE mg/dL   Nitrite NEGATIVE NEGATIVE   Leukocytes,Ua NEGATIVE NEGATIVE    Comment: Performed at [facility], [address] [place], [place] [address]   No results found.  Pending Labs [employer] (From admission, onward)    Start     Ordered   08/19/18 0149  VITAMIN D 25 Hydroxy (Vit-D Deficiency, Fractures)  Add-on,   AD     08/19/18 0149   08/19/18 0148  Vitamin B12  Add-on,   AD     08/19/18 0149   Signed and Held  Creatinine, serum  (enoxaparin (LOVENOX)    CrCl >/= 30 ml/min)  Weekly,   R    Comments: while on enoxaparin therapy    Signed and Held   Signed and Held  TSH  Add-on,   R     Signed and Held          Vitals/Pain Today's Vitals   08/19/18 0040 08/19/18 0042  BP:  125/89  Pulse:  80  Resp:  16  Temp:  98.1 F  (36.7 C)  TempSrc:  Oral  SpO2:  98%  Weight: 99.8 kg   Height: 6\' 1"  (1.854 m)   PainSc: 8      Isolation Precautions No active isolations  Medications Medications  methylPREDNISolone sodium succinate (SOLU-MEDROL) 1,000 mg in sodium chloride 0.9 % 50 mL IVPB (has no administration in time range)    Mobility walks Moderate fall risk   Focused Assessments Neuro Assessment Handoff:  Swallow screen pass? Pt has PMH of MS         Neuro Assessment:   Neuro Checks:      Last Documented NIHSS Modified Score:   Has TPA been given? No If patient is a Neuro Trauma and patient is going to OR before floor call report to [place] nurse: [phone] or [phone]     R Recommendations: See Admitting Provider Note  Report given to:   Additional Notes: Pt has PMH of MS. Pt is not taking meds for this.

## 2018-08-19 NOTE — ED Triage Notes (Signed)
Pt to ED via EMS. Pt was walking down road when his legs gave out, pt has hx of MS. Pt takes no medication for MS. Pt states his legs have been feeling weaker recently. States he cannot feel his legs, 8/10 bilateral leg pain. VSS.

## 2018-08-19 NOTE — ED Provider Notes (Signed)
[facility] Emergency Department Provider Note  ____________________________________________   First MD Initiated Contact with Patient 08/19/18 [phone]     (approximate)  I have reviewed the triage vital signs and the nursing notes.   HISTORY  Chief Complaint Multiple Sclerosis    HPI Evan Braunerry R Rostad Jr. is a 43 y.o. male with a history of multiple sclerosis who takes no medications at baseline.  He presents by EMS for evaluation of acute onset of complete inability to walk.  He says that his symptoms have been getting worse recently and gradually over a period of time, mostly notable in terms of weakness in his legs.  Tonight he just got to the point where he simply could no longer walk.  He has almost no strength in his left leg and his right leg does not have much more than that.  He said that he feels numbness and tingling in his arms but he still is able to move them.  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  He has not had any contact with COVID-19 patients.  He denies fever, difficulty swallowing, sore throat, chest pain, shortness of breath, cough, nausea, vomiting, abdominal pain.  He reports that he has had a little bit more difficulty getting to the bathroom on time and he has had some overflow incontinence before he can get to the toilet but he has not had any trouble urinating once he gets there.  He describes his symptoms as severe nothing in particular makes them better or worse.  He is also having some pain in both of his legs bilaterally that he describes as aching and throbbing.         Past Medical History:  Diagnosis Date  . MS (multiple sclerosis) [facility](HCC)     Patient Active Problem List   Diagnosis Date Noted  . Multiple sclerosis exacerbation (HCC) 08/19/2018  . Multiple sclerosis (HCC) 04/23/2018    Past Surgical History:  Procedure Laterality Date  . APPENDECTOMY     . arm surgery    . CHOLECYSTECTOMY      Prior to Admission medications   Medication Sig Start Date End Date Taking? Authorizing Provider  buPROPion (WELLBUTRIN XL) 150 MG 24 hr tablet Take 1 tablet (150 mg total) by mouth every morning. Patient not taking: Reported on 08/19/2018 04/28/18   Enid BaasKalisetti, Radhika, MD  cholecalciferol (VITAMIN D3) 25 MCG (1000 UT) tablet Take 2 tablets (2,000 Units total) by mouth daily. Patient not taking: Reported on 08/19/2018 04/29/18   Enid BaasKalisetti, Radhika, MD  methocarbamol (ROBAXIN) 750 MG tablet Take 1 tablet (750 mg total) by mouth every 8 (eight) hours as needed for muscle spasms. Patient not taking: Reported on 08/19/2018 04/28/18   Enid BaasKalisetti, Radhika, MD  vitamin B-12 1000 MCG tablet Take 1 tablet (1,000 mcg total) by mouth daily. Patient not taking: Reported on 08/19/2018 04/29/18   Enid BaasKalisetti, Radhika, MD    Allergies Penicillins  Family History  Problem Relation Age of Onset  . Thyroid disease Mother   . Diabetes Father   . CAD Father     Social History Social History   Tobacco Use  . Smoking status: Current Some Day Smoker    Years: 1.00  . Smokeless tobacco: Never Used  Substance Use Topics  . Alcohol use: Not Currently  . Drug use: Yes    Types: Marijuana    Review of Systems Constitutional:  No fever/chills Eyes: No visual changes. ENT: No sore throat. Cardiovascular: Denies chest pain. Respiratory: Denies shortness of breath. Gastrointestinal: No abdominal pain.  No nausea, no vomiting.  No diarrhea.  No constipation. Genitourinary: Negative for dysuria. Musculoskeletal: Negative for neck pain.  Negative for back pain. Integumentary: Negative for rash. Neurological: Gradually worsening weakness and legs as well has numbness and tingling and some weakness in his arms.   ____________________________________________   PHYSICAL EXAM:  VITAL SIGNS: ED Triage Vitals  Enc Vitals Group     BP 08/19/18 0042 125/89     Pulse Rate  08/19/18 0042 80     Resp 08/19/18 0042 16     Temp 08/19/18 0042 98.1 F (36.7 C)     Temp Source 08/19/18 0042 Oral     SpO2 08/19/18 0042 98 %     Weight 08/19/18 0040 99.8 kg (220 lb)     Height 08/19/18 0040 1.854 m (6\' 1" )     Head Circumference --      Peak Flow --      Pain Score 08/19/18 0040 8     Pain Loc --      Pain Edu? --      Excl. in GC? --     Constitutional: Alert and oriented. Well appearing and in no acute distress. Eyes: Conjunctivae are normal.  Head: Atraumatic. Nose: No congestion/rhinnorhea. Mouth/Throat: Mucous membranes are moist. Neck: No stridor.  No meningeal signs.   Cardiovascular: Normal rate, regular rhythm. Good peripheral circulation. Grossly normal heart sounds. Respiratory: Normal respiratory effort.  No retractions. No audible wheezing. Gastrointestinal: Soft and nontender. No distention.  Musculoskeletal: No lower extremity tenderness nor edema. No gross deformities of extremities. Neurologic:  Normal speech and language.  Profound motor deficit in lower extremities, more noticeable on the right where the patient cannot lift his leg up off the bed.  Left leg also has no plantar flexion or dorsiflexion ability.  He has normal sensation.  His right leg is able to lift off the bed just slightly but immediately collapses against gravity.  He is able to plantarflex but not dorsiflex his right foot.  The muscles of bilateral arms are very weak and while he is able to raise them and resist against gravity he is not able to push against any resistance other than gravity.  Dramatically decreased grip strength.  Subjective tingling and decreased sensation in both arms. Skin:  Skin is warm, dry and intact. No rash noted. Psychiatric: Mood and affect are normal. Speech and behavior are normal.  ____________________________________________   LABS (all labs ordered are listed, but only abnormal results are displayed)  Labs Reviewed  CBC WITH  DIFFERENTIAL/PLATELET - Abnormal; Notable for the following components:      Result Value   Monocytes Absolute 1.1 (*)    All other components within normal limits  COMPREHENSIVE METABOLIC PANEL - Abnormal; Notable for the following components:   Calcium 8.7 (*)    AST 12 (*)    All other components within normal limits  URINALYSIS, ROUTINE W REFLEX MICROSCOPIC - Abnormal; Notable for the following components:   Color, Urine YELLOW (*)    APPearance CLEAR (*)    Specific Gravity, Urine 1.032 (*)    All other components within normal limits  SARS CORONAVIRUS 2 (HOSPITAL ORDER, PERFORMED IN [facility])  TSH  VITAMIN B12  VITAMIN D 25 HYDROXY (VIT D DEFICIENCY, FRACTURES)   ____________________________________________  EKG  ED ECG REPORT I, Kandee Keenory  York CeriseForbach, the attending physician, personally viewed and interpreted this ECG.  Date: 08/19/2018 EKG Time: 00: 46 Rate: 75 Rhythm: normal sinus rhythm with occasional PVC QRS Axis: normal Intervals: normal ST/T Wave abnormalities: normal Narrative Interpretation: no evidence of acute ischemia  ____________________________________________  RADIOLOGY   ED MD interpretation: MR brain, cervical spine, thoracic spine, and lumbar spine (all with and without contrast) are pending at the time of admission  ____________________________________________   PROCEDURES   Procedure(s) performed (including Critical Care):  Procedures   ____________________________________________   INITIAL IMPRESSION / MDM / ASSESSMENT AND PLAN / ED COURSE  As part of my medical decision making, I reviewed the following data within the electronic MEDICAL RECORD NUMBER Nursing notes reviewed and incorporated, Labs reviewed , EKG interpreted , Old chart reviewed, Discussed with admitting physician (Dr. Anne HahnWillis) and Notes from prior ED visits   Differential diagnosis includes, but is not limited to, multiple sclerosis flare, other demyelinating  disease, acute infection such as encephalopathy or meningitis.  The patient has no infectious signs or symptoms.  His symptoms have been gradually worsening over time and are now profound.  By asking him to looking back through the medical record it seems that he did not have such a lower extremity involvement previously.  Labs are pending but the patient will need MRI imaging.  However I am concerned about the fact that we are currently in a COVID-19 pandemic with multiple COVID positive patients in the ED and this patient is at high risk and needs to be started on high-dose steroids.  I will discuss with the hospitalist about getting him admitted and to a bed upstairs and obtain the MRI imaging prior to leaving the department, but it is clear the patient will need admission and neurology evaluation in the hospital.  Patient is agreeable to the plan.  COVID-19 test is pending.      Clinical Course as of Aug 18 737  Wed Aug 19, 2018  0149 CBC is within normal limits  CBC with Differential/Platelet(!) [CF]  0151 I discussed the case with Dr. Anne HahnWillis in person and we agreed with the plan to administer Solu-Medrol 1 g IV now and defer the MRIs until the patient's bed upstairs has been assigned in order to prevent the patient from staying in the ED (in which we have multiple COVID positive patients at this time) any longer than necessary.  The patient can get the MRIs when he leaves the ED and then be transported to his floor bed afterwards.I will let the hospitalist team know once the patient's coronavirus test and comprehensive metabolic panel results are back so that that orders can be placed.   [CF]  0211 Essentially normal CMP  Comprehensive metabolic panel(!) [CF]  0217 SARS Coronavirus 2: NEGATIVE [CF]  0217 Dr. Sheryle Hailiamond put in bed request, I coordinated with him and am ordering appropriate MR imaging for evaluation of probable MS flare.   [CF]  0226 I have ordered vitamin B-12 and vitamin D levels  as per prior neurology recommendations but I am holding off on supplements at this time and will defer to the inpatient team.  I ordered Solu-Medrol 1000 mg IV as per the prior neurological treatment plan.  I have ordered MRIs with and without contrast of the brain, cervical spine, thoracic spine, and lumbar spine, given the wide range of neurological symptoms patient is currently having.  Hospitalist aware.   [CF]    Clinical Course User Index [CF] Loleta RoseForbach, Trayce Maino, MD  ____________________________________________  FINAL CLINICAL IMPRESSION(S) / ED DIAGNOSES  Final diagnoses:  Multiple sclerosis ([place])     MEDICATIONS GIVEN DURING THIS VISIT:  Medications  enoxaparin (LOVENOX) injection 40 mg (has no administration in time range)  acetaminophen (TYLENOL) tablet 650 mg (has no administration in time range)    Or  acetaminophen (TYLENOL) suppository 650 mg (has no administration in time range)  ondansetron (ZOFRAN) tablet 4 mg (has no administration in time range)    Or  ondansetron (ZOFRAN) injection 4 mg (has no administration in time range)  docusate sodium (COLACE) capsule 100 mg (has no administration in time range)  methylPREDNISolone sodium succinate (SOLU-MEDROL) 1,000 mg in sodium chloride 0.9 % 50 mL IVPB (0 mg Intravenous Stopped 08/19/18 0408)  morphine 4 MG/ML injection 4 mg (4 mg Intravenous Given 08/19/18 0313)  ondansetron (ZOFRAN) injection 4 mg (4 mg Intravenous Given 08/19/18 0311)  gadobutrol (GADAVIST) 1 MMOL/ML injection 10 mL (10 mLs Intravenous Contrast Given 08/19/18 4431)     ED Discharge Orders    None      *Please note:  Evan Reynolds. was evaluated in Emergency Department on 08/19/2018 for the symptoms described in the history of present illness. He was evaluated in the context of the global COVID-19 pandemic, which necessitated consideration that the patient might be at risk for infection with the SARS-CoV-2 virus that causes COVID-19. Institutional  protocols and algorithms that pertain to the evaluation of patients at risk for COVID-19 are in a state of rapid change based on information released by regulatory bodies including the CDC and federal and state organizations. These policies and algorithms were followed during the patient's care in the ED.  Some ED evaluations and interventions may be delayed as a result of limited staffing during the pandemic.*  Note:  This document was prepared using Dragon voice recognition software and may include unintentional dictation errors.   Hinda Kehr, MD 08/19/18 [phone]

## 2018-08-19 NOTE — H&P (Signed)
Evan Braunerry R Vajda Jr. is an 43 y.o. male.   Chief Complaint: Weakness HPI: The patient with past medical history of multiple sclerosis presents to the emergency department complaining of weakness in his lower extremities that has developed over the last 3 days.  He recognizes the tingling and weakness of his lower extremities as a flare of his multiple sclerosis.  This morning he was barely able to stand and indeed fell due to weakness in his legs.  He did not hit his head but he did suffer an abrasion to his left elbow.  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  The patient received Solu-Medrol in the emergency department prior to the hospitalist service being called for admission.  Past Medical History:  Diagnosis Date  . MS (multiple sclerosis) (HCC)     Past Surgical History:  Procedure Laterality Date  . APPENDECTOMY    . arm surgery    . CHOLECYSTECTOMY      Family History  Problem Relation Age of Onset  . Thyroid disease Mother   . Diabetes Father   . CAD Father    Social History:  reports that he has been smoking. He has smoked for the past 1.00 year. He has never used smokeless tobacco. He reports previous alcohol use. He reports current drug use. Drug: Marijuana.  Allergies:  Allergies  Allergen Reactions  . Penicillins Other (See Comments)    Was told that if he takes, it will kill him Did it involve swelling of the face/tongue/throat, SOB, or low BP? Unknown Did it involve sudden or severe rash/hives, skin peeling, or any reaction on the inside of your mouth or nose? Unknown Did you need to seek medical attention at a hospital or doctor's office? Unknown When did it last happen?childhood If all above answers are "NO", may proceed with cephalosporin use.     Medications Prior to Admission  Medication Sig Dispense Refill  . buPROPion  (WELLBUTRIN XL) 150 MG 24 hr tablet Take 1 tablet (150 mg total) by mouth every morning. (Patient not taking: Reported on 08/19/2018) 30 tablet 2  . cholecalciferol (VITAMIN D3) 25 MCG (1000 UT) tablet Take 2 tablets (2,000 Units total) by mouth daily. (Patient not taking: Reported on 08/19/2018) 30 tablet 2  . methocarbamol (ROBAXIN) 750 MG tablet Take 1 tablet (750 mg total) by mouth every 8 (eight) hours as needed for muscle spasms. (Patient not taking: Reported on 08/19/2018) 30 tablet 0  . vitamin B-12 1000 MCG tablet Take 1 tablet (1,000 mcg total) by mouth daily. (Patient not taking: Reported on 08/19/2018) 30 tablet 2    Results for orders placed or performed during the hospital encounter of 08/19/18 (from the past 48 hour(s))  SARS Coronavirus 2 (CEPHEID - Performed in [facility] hospital lab), Hosp Order     Status: None   Collection Time: 08/19/18 12:58 AM   Specimen: Nasopharyngeal Swab  Result Value Ref Range   SARS Coronavirus 2 NEGATIVE NEGATIVE    Comment: (NOTE) If result is NEGATIVE SARS-CoV-2 target nucleic acids are NOT DETECTED. The SARS-CoV-2 RNA is generally detectable in upper and lower  respiratory specimens during the acute phase of infection. The lowest  concentration of SARS-CoV-2 viral copies this assay can detect is 250  copies / mL. A negative result does not preclude SARS-CoV-2 infection  and should not be used as the  sole basis for treatment or other  patient management decisions.  A negative result may occur with  improper specimen collection / handling, submission of specimen other  than nasopharyngeal swab, presence of viral mutation(s) within the  areas targeted by this assay, and inadequate number of viral copies  (<250 copies / mL). A negative result must be combined with clinical  observations, patient history, and epidemiological information. If result is POSITIVE SARS-CoV-2 target nucleic acids are DETECTED. The SARS-CoV-2 RNA is generally detectable  in upper and lower  respiratory specimens dur ing the acute phase of infection.  Positive  results are indicative of active infection with SARS-CoV-2.  Clinical  correlation with patient history and other diagnostic information is  necessary to determine patient infection status.  Positive results do  not rule out bacterial infection or co-infection with other viruses. If result is PRESUMPTIVE POSTIVE SARS-CoV-2 nucleic acids MAY BE PRESENT.   A presumptive positive result was obtained on the submitted specimen  and confirmed on repeat testing.  While 2019 novel coronavirus  (SARS-CoV-2) nucleic acids may be present in the submitted sample  additional confirmatory testing may be necessary for epidemiological  and / or clinical management purposes  to differentiate between  SARS-CoV-2 and other Sarbecovirus currently known to infect humans.  If clinically indicated additional testing with an alternate test  methodology [phone]([id number]) is advised. The SARS-CoV-2 RNA is generally  detectable in upper and lower respiratory sp ecimens during the acute  phase of infection. The expected result is Negative. Fact Sheet for Patients:  BoilerBrush.com.cyhttps://www.fda.gov/media/136312/download Fact Sheet for Healthcare Providers: https://pope.com/https://www.fda.gov/media/136313/download This test is not yet approved or cleared by the [place] FDA and has been authorized for detection and/or diagnosis of SARS-CoV-2 by FDA under an Emergency Use Authorization (EUA).  This EUA will remain in effect (meaning this test can be used) for the duration of the COVID-19 declaration under Section 564(b)(1) of the Act, 21 U.S.C. section 360bbb-3(b)(1), unless the authorization is terminated or revoked sooner. Performed at [facility], [address] [place], [place] [address]   CBC with Differential/Platelet     Status: Abnormal   Collection Time: 08/19/18 12:58 AM  Result Value Ref Range   WBC 9.5 4.0 - 10.5 K/uL   RBC  5.01 4.22 - 5.81 MIL/uL   Hemoglobin 15.6 13.0 - 17.0 g/dL   HCT 81.147.1 91.439.0 - 78.252.0 %   MCV 94.0 80.0 - 100.0 fL   MCH 31.1 26.0 - 34.0 pg   MCHC 33.1 30.0 - 36.0 g/dL   RDW 95.613.5 21.311.5 - 08.615.5 %   Platelets 326 150 - 400 K/uL   nRBC 0.0 0.0 - 0.2 %   Neutrophils Relative % 48 %   Neutro Abs 4.5 1.7 - 7.7 K/uL   Lymphocytes Relative 36 %   Lymphs Abs 3.4 0.7 - 4.0 K/uL   Monocytes Relative 12 %   Monocytes Absolute 1.1 (H) 0.1 - 1.0 K/uL   Eosinophils Relative 3 %   Eosinophils Absolute 0.3 0.0 - 0.5 K/uL   Basophils Relative 1 %   Basophils Absolute 0.1 0.0 - 0.1 K/uL   Immature Granulocytes 0 %   Abs Immature Granulocytes 0.03 0.00 - 0.07 K/uL    Comment: Performed at [facility], [address] [place], [place] [address]  Comprehensive metabolic panel     Status: Abnormal   Collection Time: 08/19/18 12:58 AM  Result Value Ref Range   Sodium 142 135 - 145 mmol/L   Potassium  4.1 3.5 - 5.1 mmol/L   Chloride 107 98 - 111 mmol/L   CO2 29 22 - 32 mmol/L   Glucose, Bld 92 70 - 99 mg/dL   BUN 15 6 - 20 mg/dL   Creatinine, Ser 8.651.08 0.61 - 1.24 mg/dL   Calcium 8.7 (L) 8.9 - 10.3 mg/dL   Total Protein 6.9 6.5 - 8.1 g/dL   Albumin 4.0 3.5 - 5.0 g/dL   AST 12 (L) 15 - 41 U/L   ALT 13 0 - 44 U/L   Alkaline Phosphatase 54 38 - 126 U/L   Total Bilirubin 0.6 0.3 - 1.2 mg/dL   GFR calc non Af Amer >60 >60 mL/min   GFR calc Af Amer >60 >60 mL/min   Anion gap 6 5 - 15    Comment: Performed at [facility], [address] [place], [place] [address]  Urinalysis, Routine w reflex microscopic     Status: Abnormal   Collection Time: 08/19/18 12:58 AM  Result Value Ref Range   Color, Urine YELLOW (A) YELLOW   APPearance CLEAR (A) CLEAR   Specific Gravity, Urine 1.032 (H) 1.005 - 1.030   pH 5.0 5.0 - 8.0   Glucose, UA NEGATIVE NEGATIVE mg/dL   Hgb urine dipstick NEGATIVE NEGATIVE   Bilirubin Urine NEGATIVE NEGATIVE   Ketones, ur NEGATIVE NEGATIVE mg/dL   Protein, ur  NEGATIVE NEGATIVE mg/dL   Nitrite NEGATIVE NEGATIVE   Leukocytes,Ua NEGATIVE NEGATIVE    Comment: Performed at [facility], [address] [place], [place] [address]   No results found.  Review of Systems  Constitutional: Negative for chills and fever.  HENT: Negative for sore throat and tinnitus.   Eyes: Negative for blurred vision and redness.  Respiratory: Negative for cough and shortness of breath.   Cardiovascular: Negative for chest pain, palpitations, orthopnea and PND.  Gastrointestinal: Negative for abdominal pain, diarrhea, nausea and vomiting.  Genitourinary: Negative for dysuria, frequency and urgency.  Musculoskeletal: Negative for joint pain and myalgias.  Skin: Negative for rash.       No lesions  Neurological: Positive for focal weakness and weakness. Negative for speech change.  Endo/Heme/Allergies: Does not bruise/bleed easily.       No temperature intolerance  Psychiatric/Behavioral: Negative for depression and suicidal ideas.    Blood pressure 123/78, pulse 64, temperature 98.1 F (36.7 C), temperature source Oral, resp. rate 19, height 6\' 1"  (1.854 m), weight 99.8 kg, SpO2 96 %. Physical Exam  Vitals reviewed. Constitutional: He is oriented to person, place, and time. He appears well-developed. No distress.  HENT:  Head: Normocephalic and atraumatic.  Mouth/Throat: Oropharynx is clear and moist.  Eyes: Pupils are equal, round, and reactive to light. Conjunctivae and EOM are normal. No scleral icterus.  Neck: Normal range of motion. Neck supple. No JVD present. No tracheal deviation present. No thyromegaly present.  Cardiovascular: Normal rate, regular rhythm and normal heart sounds. Exam reveals no gallop and no friction rub.  No murmur heard. Respiratory: Effort normal and breath sounds normal. No respiratory distress.  GI: Soft. Bowel sounds are normal. He exhibits no distension. There is no abdominal tenderness.  Genitourinary:     Genitourinary Comments: Deferred   Musculoskeletal: Normal range of motion.        General: No edema.  Lymphadenopathy:    He has no cervical adenopathy.  Neurological: He is alert and oriented to person, place, and time. No cranial nerve deficit.  Skin: Skin is warm and dry. No  rash noted. No erythema.  Psychiatric: He has a normal mood and affect. His behavior is normal. Judgment and thought content normal.     Assessment/Plan This is a 43 year old male admitted for multiple sclerosis exacerbation. 1.  Multiple sclerosis flare: Continue steroids; consult neurology.  The patient does not take maintenance medication for his multiple sclerosis. 2.  Depression: Stable; continue Wellbutrin 3.  Overweight: BMI is 30.7; encouraged healthy diet and exercise 4.  DVT prophylaxis: Lovenox 5.  GI prophylaxis: None The patient is a full code.  Time spent on admission orders and patient care approximately 45 minutes  Harrie Foreman, MD 08/19/2018, 2:59 AM

## 2018-08-19 NOTE — Progress Notes (Signed)
Same day rounding progress note  43 year old male admitted for MS exacerbation consisting of BLE and LUE weakness and numbness.   *Multiple sclerosis exacerbation - MRI showing multiple brain and cervical cord plaques with no evidence of enhancement.   -Appreciate neurology input  -Continue vitamin B12 and D levels with supplementation, Baclofen for muscle spasms, Solumedrol 1000mg  IV for 3-5 days.   - PT/OT   * Depression: Stable; continue Wellbutrin  * Overweight: BMI is 30.7; encouraged healthy diet and exercise  Time spent: 15 minutes

## 2018-08-19 NOTE — Consult Note (Signed)
Reason for Consult:MS exacerbation Referring Physician: Sherryll Burger  CC: BLE weakness  HPI: Evan Reynolds. is an 43 y.o. male with a history of MS on no DMT nor currently being followed by a neurologist.  Reports last exacerbation was in March.  Has been diagnosed with RRMS for about 20 years.  Was on Rebif in the past but reports it was not effective.  About 2 days ago felt as if an exacerbation was coming.  Started to feel weak in his legs.  Last evening became acutely worse and was unable to ambulate.  Today feels his left arm is becoming weak as well.  No complains of bowel/bladder incontinence or saddle anesthesia.      Past Medical History:  Diagnosis Date  . MS (multiple sclerosis) (HCC)     Past Surgical History:  Procedure Laterality Date  . APPENDECTOMY    . arm surgery    . CHOLECYSTECTOMY      Family History  Problem Relation Age of Onset  . Thyroid disease Mother   . Diabetes Father   . CAD Father     Social History:  reports that he has been smoking. He has smoked for the past 1.00 year. He has never used smokeless tobacco. He reports previous alcohol use. He reports current drug use. Drug: Marijuana.  Allergies  Allergen Reactions  . Penicillins Other (See Comments)    Was told that if he takes, it will kill him Did it involve swelling of the face/tongue/throat, SOB, or low BP? Unknown Did it involve sudden or severe rash/hives, skin peeling, or any reaction on the inside of your mouth or nose? Unknown Did you need to seek medical attention at a hospital or doctor's office? Unknown When did it last happen?childhood If all above answers are "NO", may proceed with cephalosporin use.     Medications:  I have reviewed the patient's current medications. Prior to Admission:  Medications Prior to Admission  Medication Sig Dispense Refill Last Dose  . buPROPion (WELLBUTRIN XL) 150 MG 24 hr tablet Take 1 tablet (150 mg total) by mouth every morning. (Patient not  taking: Reported on 08/19/2018) 30 tablet 2 Not Taking at Unknown time  . cholecalciferol (VITAMIN D3) 25 MCG (1000 UT) tablet Take 2 tablets (2,000 Units total) by mouth daily. (Patient not taking: Reported on 08/19/2018) 30 tablet 2 Not Taking at Unknown time  . methocarbamol (ROBAXIN) 750 MG tablet Take 1 tablet (750 mg total) by mouth every 8 (eight) hours as needed for muscle spasms. (Patient not taking: Reported on 08/19/2018) 30 tablet 0 Not Taking at Unknown time  . vitamin B-12 1000 MCG tablet Take 1 tablet (1,000 mcg total) by mouth daily. (Patient not taking: Reported on 08/19/2018) 30 tablet 2 Not Taking at Unknown time   Scheduled: . baclofen  10 mg Oral BID  . docusate sodium  100 mg Oral BID  . enoxaparin (LOVENOX) injection  40 mg Subcutaneous Q24H    ROS: History obtained from the patient  General ROS: negative for - chills, fatigue, fever, night sweats, weight gain or weight loss Psychological ROS: negative for - behavioral disorder, hallucinations, memory difficulties, mood swings or suicidal ideation Ophthalmic ROS: negative for - blurry vision, double vision, eye pain or loss of vision ENT ROS: negative for - epistaxis, nasal discharge, oral lesions, sore throat, tinnitus or vertigo Allergy and Immunology ROS: negative for - hives or itchy/watery eyes Hematological and Lymphatic ROS: negative for - bleeding problems, bruising or swollen  lymph nodes Endocrine ROS: negative for - galactorrhea, hair pattern changes, polydipsia/polyuria or temperature intolerance Respiratory ROS: negative for - cough, hemoptysis, shortness of breath or wheezing Cardiovascular ROS: negative for - chest pain, dyspnea on exertion, edema or irregular heartbeat Gastrointestinal ROS: negative for - abdominal pain, diarrhea, hematemesis, nausea/vomiting or stool incontinence Genito-Urinary ROS: negative for - dysuria, hematuria, incontinence or urinary frequency/urgency Musculoskeletal ROS: neck pain  with flexion Neurological ROS: as noted in HPI Dermatological ROS: negative for rash and skin lesion changes  Physical Examination: Blood pressure 105/73, pulse 71, temperature 97.8 F (36.6 C), temperature source Oral, resp. rate 18, height  (1.854 m), weight 105.9 kg, SpO2 95 %.  HEENT-  Normocephalic, no lesions, without obvious abnormality.  Normal external eye and conjunctiva.  Normal TM's bilaterally.  Normal auditory canals and external ears. Normal external nose, mucus membranes and septum.  Normal pharynx. Cardiovascular- S1, S2 normal, pulses palpable throughout   Lungs- chest clear, no wheezing, rales, normal symmetric air entry Abdomen- soft, non-tender; bowel sounds normal; no masses,  no organomegaly Extremities- mild BLE edema Lymph-no adenopathy palpable Musculoskeletal-no joint tenderness, deformity or swelling Skin-warm and dry, no hyperpigmentation, vitiligo, or suspicious lesions  Neurological Examination   Mental Status: Alert, oriented, thought content appropriate.  Speech fluent without evidence of aphasia.  Able to follow 3 step commands without difficulty. Cranial Nerves: II: Discs flat bilaterally; Visual fields grossly normal, pupils equal, round, reactive to light and accommodation III,IV, VI: ptosis not present, extra-ocular motions intact bilaterally V,VII: smile symmetric, facial light touch sensation normal bilaterally VIII: hearing normal bilaterally IX,X: gag reflex present XI: bilateral shoulder shrug XII: midline tongue extension Motor: Right : Upper extremity   5-/5    Left:     Upper extremity   4/5  Lower extremity   2/5     Lower extremity   0/5 Increased tone on the right Sensory: Pinprick and light touch decreased in the LUE and in the BLE's below the knees Deep Tendon Reflexes: 3+ throughout with sustained clonus at the right ankle Plantars: Right: mute   Left: mute Cerebellar: Normal finger-to-nose testing.  Unable to perform  heel-to-shin testing due to weakness Gait: not tested due to safety concerns    Laboratory Studies:   Basic Metabolic Panel: Recent Labs  Lab 08/19/18 0058  NA 142  K 4.1  CL 107  CO2 29  GLUCOSE 92  BUN 15  CREATININE 1.08  CALCIUM 8.7*    Liver Function Tests: Recent Labs  Lab 08/19/18 0058  AST 12*  ALT 13  ALKPHOS 54  BILITOT 0.6  PROT 6.9  ALBUMIN 4.0   No results for input(s): LIPASE, AMYLASE in the last 168 hours. No results for input(s): AMMONIA in the last 168 hours.  CBC: Recent Labs  Lab 08/19/18 0058  WBC 9.5  NEUTROABS 4.5  HGB 15.6  HCT 47.1  MCV 94.0  PLT 326    Cardiac Enzymes: No results for input(s): CKTOTAL, CKMB, CKMBINDEX, TROPONINI in the last 168 hours.  BNP: Invalid input(s): POCBNP  CBG: No results for input(s): GLUCAP in the last 168 hours.  Microbiology: Results for orders placed or performed during the hospital encounter of 08/19/18  SARS Coronavirus 2 (CEPHEID - Performed in [facility] hospital lab), Hosp Order     Status: None   Collection Time: 08/19/18 12:58 AM   Specimen: Nasopharyngeal Swab  Result Value Ref Range Status   SARS Coronavirus 2 NEGATIVE NEGATIVE Final    Comment: (  NOTE) If result is NEGATIVE SARS-CoV-2 target nucleic acids are NOT DETECTED. The SARS-CoV-2 RNA is generally detectable in upper and lower  respiratory specimens during the acute phase of infection. The lowest  concentration of SARS-CoV-2 viral copies this assay can detect is 250  copies / mL. A negative result does not preclude SARS-CoV-2 infection  and should not be used as the sole basis for treatment or other  patient management decisions.  A negative result may occur with  improper specimen collection / handling, submission of specimen other  than nasopharyngeal swab, presence of viral mutation(s) within the  areas targeted by this assay, and inadequate number of viral copies  (<250 copies / mL). A negative result must be combined  with clinical  observations, patient history, and epidemiological information. If result is POSITIVE SARS-CoV-2 target nucleic acids are DETECTED. The SARS-CoV-2 RNA is generally detectable in upper and lower  respiratory specimens dur ing the acute phase of infection.  Positive  results are indicative of active infection with SARS-CoV-2.  Clinical  correlation with patient history and other diagnostic information is  necessary to determine patient infection status.  Positive results do  not rule out bacterial infection or co-infection with other viruses. If result is PRESUMPTIVE POSTIVE SARS-CoV-2 nucleic acids MAY BE PRESENT.   A presumptive positive result was obtained on the submitted specimen  and confirmed on repeat testing.  While 2019 novel coronavirus  (SARS-CoV-2) nucleic acids may be present in the submitted sample  additional confirmatory testing may be necessary for epidemiological  and / or clinical management purposes  to differentiate between  SARS-CoV-2 and other Sarbecovirus currently known to infect humans.  If clinically indicated additional testing with an alternate test  methodology [phone] is advised. The SARS-CoV-2 RNA is generally  detectable in upper and lower respiratory sp ecimens during the acute  phase of infection. The expected result is Negative. Fact Sheet for Patients:  BoilerBrush.com.cyhttps://www.fda.gov/media/136312/download Fact Sheet for Healthcare Providers: https://pope.com/https://www.fda.gov/media/136313/download This test is not yet approved or cleared by the [place] FDA and has been authorized for detection and/or diagnosis of SARS-CoV-2 by FDA under an Emergency Use Authorization (EUA).  This EUA will remain in effect (meaning this test can be used) for the duration of the COVID-19 declaration under Section 564(b)(1) of the Act, 21 U.S.C. section 360bbb-3(b)(1), unless the authorization is terminated or revoked sooner. Performed at [facility], [address] [place], [place] [address]     Coagulation Studies: No results for input(s): LABPROT, INR in the last 72 hours.  Urinalysis:  Recent Labs  Lab 08/19/18 0058  COLORURINE YELLOW*  LABSPEC 1.032*  PHURINE 5.0  GLUCOSEU NEGATIVE  HGBUR NEGATIVE  BILIRUBINUR NEGATIVE  KETONESUR NEGATIVE  PROTEINUR NEGATIVE  NITRITE NEGATIVE  LEUKOCYTESUR NEGATIVE    Lipid Panel:  No results found for: CHOL, TRIG, HDL, CHOLHDL, VLDL, LDLCALC  HgbA1C: No results found for: HGBA1C  Urine Drug Screen:      Component Value Date/Time   LABOPIA NONE DETECTED 04/23/2018 1845   COCAINSCRNUR NONE DETECTED 04/23/2018 1845   LABBENZ NONE DETECTED 04/23/2018 1845   AMPHETMU NONE DETECTED 04/23/2018 1845   THCU POSITIVE (A) 04/23/2018 1845   LABBARB NONE DETECTED 04/23/2018 1845    Alcohol Level: No results for input(s): ETH in the last 168 hours.  Other results: EKG: sinus rhythm at 75 bpm.  Imaging: Mr Laqueta JeanBrain W And Wo Contrast  Result Date: 08/19/2018 CLINICAL DATA:  Multiple sclerosis not on medication. New neurologic event  EXAM: MRI HEAD WITHOUT AND WITH CONTRAST TECHNIQUE: Multiplanar, multiecho pulse sequences of the brain and surrounding structures were obtained without and with intravenous contrast. CONTRAST:  10 cc Gadavist intravenous COMPARISON:  None. FINDINGS: Brain: Numerous, in places confluent plaque-like areas in the bilateral cerebral white matter spanning periventricular to juxta cortical. Patchy FLAIR hyperintensity in the deep cerebellum, greatest about the right aspect of the fourth ventricle. There is associated cerebral white matter loss with corpus callosum thinning. No superimposed infarct, mass, hydrocephalus, or collection. 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 Chronic left pontine lacune which could be inflammatory or ischemic. No abnormal intracranial enhancement or restricted diffusion. Vascular: Major flow voids and vascular enhancements  are preserved Skull and upper cervical spine: Negative for marrow lesion Sinuses/Orbits: Negative IMPRESSION: 1. Typical multiple sclerosis pattern with extensive plaque and cerebral volume loss. No restricted diffusion or enhancement. 2. 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 Electronically Signed   By: Monte Fantasia M.D.   On: 08/19/2018 07:04   Mr Cervical Spine W Wo Contrast  Result Date: 08/19/2018 CLINICAL DATA:  Multiple sclerosis with new neurologic event EXAM: MRI TOTAL SPINE WITHOUT AND WITH CONTRAST TECHNIQUE: Multisequence MR imaging of the spine from the cervical spine to the sacrum was performed prior to and following IV contrast administration CONTRAST:  10 cc Gadavist intravenous COMPARISON:  None. FINDINGS: MRI CERVICAL SPINE FINDINGS Alignment: Normal Vertebrae: No fracture, evidence of discitis, or bone lesion. Cord: Short segment plaque in the left cord at C3-4 and in the central cord at C4-5. No cord thinning. No plaque enhancement or expansion. Posterior Fossa, vertebral arteries, paraspinal tissues: Negative Disc levels: Minor degenerative changes without canal or foraminal impingement. C4-5 mild to moderate right foraminal narrowing from uncovertebral ridging. MRI THORACIC SPINE FINDINGS Alignment:  Exaggerated thoracic kyphosis.  Negative for listhesis. Vertebrae: No fracture, evidence of discitis, or bone lesion. Cord: Mild ventral cord flattening at T9-10 due to disc protrusion. No plaque is seen. Paraspinal and other soft tissues: Negative Disc levels: Spondylosis and exaggerated thoracic kyphosis. Small disc protrusions at T7-8, T8-9, and T9-10. Mild cord mass effect at T9-10. MRI LUMBAR SPINE FINDINGS Segmentation:  5 lumbar type vertebrae Alignment:  Negative for listhesis. Vertebrae:  No fracture, evidence of discitis, or bone lesion. Conus medullaris: Extends to the L1 level and appears normal. Paraspinal and other soft tissues: Negative Disc levels: L4-5 disc  narrowing and bulging with posterior annular fissure. Tiny central protrusion. No neural compression L5-S1 mild disc narrowing and desiccation with small noncompressive central protrusion. IMPRESSION: 1. Left and central cord plaques at C3-4 and C4-5 without enhancement. 2. Negative thoracic cord and cauda equina. Electronically Signed   By: Monte Fantasia M.D.   On: 08/19/2018 07:13   Mr Thoracic Spine W Wo Contrast  Result Date: 08/19/2018 CLINICAL DATA:  Multiple sclerosis with new neurologic event EXAM: MRI TOTAL SPINE WITHOUT AND WITH CONTRAST TECHNIQUE: Multisequence MR imaging of the spine from the cervical spine to the sacrum was performed prior to and following IV contrast administration CONTRAST:  10 cc Gadavist intravenous COMPARISON:  None. FINDINGS: MRI CERVICAL SPINE FINDINGS Alignment: Normal Vertebrae: No fracture, evidence of discitis, or bone lesion. Cord: Short segment plaque in the left cord at C3-4 and in the central cord at C4-5. No cord thinning. No plaque enhancement or expansion. Posterior Fossa, vertebral arteries, paraspinal tissues: Negative Disc levels: Minor degenerative changes without canal or foraminal impingement. C4-5 mild to moderate right foraminal narrowing  from uncovertebral ridging. MRI THORACIC SPINE FINDINGS Alignment:  Exaggerated thoracic kyphosis.  Negative for listhesis. Vertebrae: No fracture, evidence of discitis, or bone lesion. Cord: Mild ventral cord flattening at T9-10 due to disc protrusion. No plaque is seen. Paraspinal and other soft tissues: Negative Disc levels: Spondylosis and exaggerated thoracic kyphosis. Small disc protrusions at T7-8, T8-9, and T9-10. Mild cord mass effect at T9-10. MRI LUMBAR SPINE FINDINGS Segmentation:  5 lumbar type vertebrae Alignment:  Negative for listhesis. Vertebrae:  No fracture, evidence of discitis, or bone lesion. Conus medullaris: Extends to the L1 level and appears normal. Paraspinal and other soft tissues: Negative  Disc levels: L4-5 disc narrowing and bulging with posterior annular fissure. Tiny central protrusion. No neural compression L5-S1 mild disc narrowing and desiccation with small noncompressive central protrusion. IMPRESSION: 1. Left and central cord plaques at C3-4 and C4-5 without enhancement. 2. Negative thoracic cord and cauda equina. Electronically Signed   By: Marnee SpringJonathon  Watts M.D.   On: 08/19/2018 07:13   Mr Lumbar Spine W Wo Contrast  Result Date: 08/19/2018 CLINICAL DATA:  Multiple sclerosis with new neurologic event EXAM: MRI TOTAL SPINE WITHOUT AND WITH CONTRAST TECHNIQUE: Multisequence MR imaging of the spine from the cervical spine to the sacrum was performed prior to and following IV contrast administration CONTRAST:  10 cc Gadavist intravenous COMPARISON:  None. FINDINGS: MRI CERVICAL SPINE FINDINGS Alignment: Normal Vertebrae: No fracture, evidence of discitis, or bone lesion. Cord: Short segment plaque in the left cord at C3-4 and in the central cord at C4-5. No cord thinning. No plaque enhancement or expansion. Posterior Fossa, vertebral arteries, paraspinal tissues: Negative Disc levels: Minor degenerative changes without canal or foraminal impingement. C4-5 mild to moderate right foraminal narrowing from uncovertebral ridging. MRI THORACIC SPINE FINDINGS Alignment:  Exaggerated thoracic kyphosis.  Negative for listhesis. Vertebrae: No fracture, evidence of discitis, or bone lesion. Cord: Mild ventral cord flattening at T9-10 due to disc protrusion. No plaque is seen. Paraspinal and other soft tissues: Negative Disc levels: Spondylosis and exaggerated thoracic kyphosis. Small disc protrusions at T7-8, T8-9, and T9-10. Mild cord mass effect at T9-10. MRI LUMBAR SPINE FINDINGS Segmentation:  5 lumbar type vertebrae Alignment:  Negative for listhesis. Vertebrae:  No fracture, evidence of discitis, or bone lesion. Conus medullaris: Extends to the L1 level and appears normal. Paraspinal and other soft  tissues: Negative Disc levels: L4-5 disc narrowing and bulging with posterior annular fissure. Tiny central protrusion. No neural compression L5-S1 mild disc narrowing and desiccation with small noncompressive central protrusion. IMPRESSION: 1. Left and central cord plaques at C3-4 and C4-5 without enhancement. 2. Negative thoracic cord and cauda equina. Electronically Signed   By: Marnee SpringJonathon  Watts M.D.   On: 08/19/2018 07:13     Assessment/Plan: 43 year old male with a history of MS presenting with an MS exacerbation consisting of BLE and LUE weakness and numbness. MR imaging reveals multiple brain and cervical cord plaques with no evidence of enhancement.  Patient given one dose of Solumedrol this AM.  Also complains of muscle spasms.   On last hospitalization had work up that included LP with positive oligoclonal bands.  Patient also with decreased B12 and vitamin D.  These need to be rechecked since patient has not been compliant with replacement and these deficiencies may have some affect on MS decompensation.    Recommendations: 1. Vitamin B12 and D levels with supplementation as required 2. Baclofen 10mg  po BID for muscle spasms 3. Solumedrol 1000mg  IV for 3-5  days.   4. PT/OT  5. GI prophylaxis 6. Will continue to follow with you  Thana FarrLeslie Alijah Akram, MD Neurology [phone] 08/19/2018, 10:26 AM

## 2018-08-19 NOTE — Plan of Care (Signed)
  Problem: Education: Goal: Knowledge of General Education information will improve Description: Including pain rating scale, medication(s)/side effects and non-pharmacologic comfort measures Outcome: Progressing   Problem: Clinical Measurements: Goal: Ability to maintain clinical measurements within normal limits will improve Outcome: Progressing   

## 2018-08-20 LAB — VITAMIN B12: Vitamin B-12: 117 pg/mL — ABNORMAL LOW (ref 180–914)

## 2018-08-20 LAB — VITAMIN D 25 HYDROXY (VIT D DEFICIENCY, FRACTURES): Vit D, 25-Hydroxy: 24.2 ng/mL — ABNORMAL LOW (ref 30.0–100.0)

## 2018-08-20 MED ORDER — SODIUM CHLORIDE 0.9 % IV SOLN
1000.0000 mg | Freq: Every day | INTRAVENOUS | Status: DC
  Administered 2018-08-20 – 2018-08-21 (×2): 1000 mg via INTRAVENOUS
  Filled 2018-08-20 (×3): qty 8

## 2018-08-20 NOTE — Progress Notes (Signed)
Patient requesting to go home. He is from out of town and has ride already arranged and knows the risks of leaving AMA. He is not interested in completing treatment here anymore. I've asked him to see Neuro or his PCP once he gets to his home or if clinical condition worsens, go to nearest ED

## 2018-08-20 NOTE — Progress Notes (Signed)
[facility] NAME: Benjimen Kelley    MR#:  [id number]  DATE OF BIRTH:  Oct 21, 1975  SUBJECTIVE:  CHIEF COMPLAINT:   Chief Complaint  Patient presents with  . Multiple Sclerosis  wanted to leave AMA but just checked with nurse and now he changed mind REVIEW OF SYSTEMS:  Review of Systems  Constitutional: Positive for malaise/fatigue. Negative for diaphoresis, fever and weight loss.  HENT: Negative for ear discharge, ear pain, hearing loss, nosebleeds, sore throat and tinnitus.   Eyes: Negative for blurred vision and pain.  Respiratory: Negative for cough, hemoptysis, shortness of breath and wheezing.   Cardiovascular: Negative for chest pain, palpitations, orthopnea and leg swelling.  Gastrointestinal: Negative for abdominal pain, blood in stool, constipation, diarrhea, heartburn, nausea and vomiting.  Genitourinary: Negative for dysuria, frequency and urgency.  Musculoskeletal: Negative for back pain and myalgias.  Skin: Negative for itching and rash.  Neurological: Negative for dizziness, tingling, tremors, focal weakness, seizures, weakness and headaches.  Psychiatric/Behavioral: Negative for depression. The patient is not nervous/anxious.     DRUG ALLERGIES:   Allergies  Allergen Reactions  . Penicillins Other (See Comments)    Was told that if he takes, it will kill him Did it involve swelling of the face/tongue/throat, SOB, or low BP? Unknown Did it involve sudden or severe rash/hives, skin peeling, or any reaction on the inside of your mouth or nose? Unknown Did you need to seek medical attention at a hospital or doctor's office? Unknown When did it last happen?childhood If all above answers are "NO", may proceed with cephalosporin use.    VITALS:  Blood pressure 128/90, pulse (!) 56, temperature 97.8 F (36.6 C), temperature source Oral, resp. rate 18, height 6\' 1"  (1.854 m), weight 105.9 kg, SpO2 93 %. PHYSICAL  EXAMINATION:  Physical Exam HENT:     Head: Normocephalic and atraumatic.  Eyes:     Conjunctiva/sclera: Conjunctivae normal.     Pupils: Pupils are equal, round, and reactive to light.  Neck:     Musculoskeletal: Normal range of motion and neck supple.     Thyroid: No thyromegaly.     Trachea: No tracheal deviation.  Cardiovascular:     Rate and Rhythm: Normal rate and regular rhythm.     Heart sounds: Normal heart sounds.  Pulmonary:     Effort: Pulmonary effort is normal. No respiratory distress.     Breath sounds: Normal breath sounds. No wheezing.  Chest:     Chest wall: No tenderness.  Abdominal:     General: Bowel sounds are normal. There is no distension.     Palpations: Abdomen is soft.     Tenderness: There is no abdominal tenderness.  Musculoskeletal: Normal range of motion.  Skin:    General: Skin is warm and dry.     Findings: No rash.  Neurological:     Mental Status: He is alert and oriented to person, place, and time.     Cranial Nerves: No cranial nerve deficit.    LABORATORY PANEL:  Male CBC Recent Labs  Lab 08/19/18 0058  WBC 9.5  HGB 15.6  HCT 47.1  PLT 326   ------------------------------------------------------------------------------------------------------------------ Chemistries  Recent Labs  Lab 08/19/18 0058  NA 142  K 4.1  CL 107  CO2 29  GLUCOSE 92  BUN 15  CREATININE 1.08  CALCIUM 8.7*  AST 12*  ALT 13  ALKPHOS 54  BILITOT 0.6   RADIOLOGY:  No  results found. ASSESSMENT AND PLAN:  43 year old male admitted for MS exacerbation consisting of BLE and LUE weakness and numbness.   *Multiple sclerosis exacerbation - MRI showing multiple brain and cervical cord plaques with no evidence of enhancement.   -Appreciate neurology input  -Continue vitamin B12 and D levels with supplementation, Baclofen for muscle spasms, Solumedrol 1000mg  IV for 3-5 days.   - PT/OT c/s  * Depression: Stable; continue Wellbutrin  * Overweight:  BMI is 30.7; encouraged healthy diet and exercise     All the records are reviewed and case discussed with Care Management/Social Worker. Management plans discussed with the patient, family and they are in agreement.  CODE STATUS: Full Code  TOTAL TIME TAKING CARE OF THIS PATIENT: 25 minutes.   More than 50% of the time was spent in counseling/coordination of care: YES  POSSIBLE D/C IN 1-2 DAYS, DEPENDING ON CLINICAL CONDITION.   Delfino Lovett M.D on 08/20/2018 at 5:11 PM  Between 7am to 6pm - Pager - [phone]  After 6pm go to [email or web address] - Social research officer, government  [facility]  Office  [phone]  CC: Primary care physician; Patient, No Pcp Per  Note: This dictation was prepared with Dragon dictation along with smaller phrase technology. Any transcriptional errors that result from this process are unintentional.

## 2018-08-20 NOTE — Progress Notes (Signed)
Subjective: Patient reports doing much better today.  Requests to leave AMA because of transportation issues.  Is clear that he has not completed his course of Solumedrol.    Objective: Current vital signs: BP 128/90 (BP Location: Left Arm)   Pulse (!) 56   Temp 97.8 F (36.6 C) (Oral)   Resp 18   Ht 6\' 1"  (1.854 m)   Wt 105.9 kg   SpO2 93%   BMI 30.79 kg/m  Vital signs in last 24 hours: Temp:  [97.8 F (36.6 C)-98.6 F (37 C)] 97.8 F (36.6 C) (07/16 0809) Pulse Rate:  [56-63] 56 (07/16 0809) Resp:  [18-19] 18 (07/16 0809) BP: (118-128)/(73-90) 128/90 (07/16 0809) SpO2:  [93 %-95 %] 93 % (07/16 0809)  Intake/Output from previous day: 07/15 0701 - 07/16 0700 In: 720 [P.O.:720] Out: 630 [Urine:630] Intake/Output this shift: No intake/output data recorded. Nutritional status:  Diet Order            Diet Heart Room service appropriate? Yes; Fluid consistency: Thin  Diet effective now              Neurologic Exam: Mental Status: Alert, oriented, thought content appropriate.  Speech fluent without evidence of aphasia.  Able to follow 3 step commands without difficulty. Cranial Nerves: II: Visual fields grossly normal, pupils equal, round, reactive to light and accommodation III,IV, VI: ptosis not present, extra-ocular motions intact bilaterally V,VII: smile symmetric, facial light touch sensation normal bilaterally VIII: hearing normal bilaterally IX,X: gag reflex present XI: bilateral shoulder shrug XII: midline tongue extension Motor: Right : Upper extremity   5/5    Left:     Upper extremity   5/5  Lower extremity   5/5     Lower extremity   5/5 Tone and bulk:normal tone throughout; no atrophy noted Sensory: Pinprick and light touch decreased below the knees in the BLE's   Lab Results: Basic Metabolic Panel: Recent Labs  Lab 08/19/18 0058  NA 142  K 4.1  CL 107  CO2 29  GLUCOSE 92  BUN 15  CREATININE 1.08  CALCIUM 8.7*    Liver Function  Tests: Recent Labs  Lab 08/19/18 0058  AST 12*  ALT 13  ALKPHOS 54  BILITOT 0.6  PROT 6.9  ALBUMIN 4.0   No results for input(s): LIPASE, AMYLASE in the last 168 hours. No results for input(s): AMMONIA in the last 168 hours.  CBC: Recent Labs  Lab 08/19/18 0058  WBC 9.5  NEUTROABS 4.5  HGB 15.6  HCT 47.1  MCV 94.0  PLT 326    Cardiac Enzymes: No results for input(s): CKTOTAL, CKMB, CKMBINDEX, TROPONINI in the last 168 hours.  Lipid Panel: No results for input(s): CHOL, TRIG, HDL, CHOLHDL, VLDL, LDLCALC in the last 168 hours.  CBG: No results for input(s): GLUCAP in the last 168 hours.  Microbiology: Results for orders placed or performed during the hospital encounter of 08/19/18  SARS Coronavirus 2 (CEPHEID - Performed in [facility] hospital lab), Hosp Order     Status: None   Collection Time: 08/19/18 12:58 AM   Specimen: Nasopharyngeal Swab  Result Value Ref Range Status   SARS Coronavirus 2 NEGATIVE NEGATIVE Final    Comment: (NOTE) If result is NEGATIVE SARS-CoV-2 target nucleic acids are NOT DETECTED. The SARS-CoV-2 RNA is generally detectable in upper and lower  respiratory specimens during the acute phase of infection. The lowest  concentration of SARS-CoV-2 viral copies this assay can detect is 250  copies /  mL. A negative result does not preclude SARS-CoV-2 infection  and should not be used as the sole basis for treatment or other  patient management decisions.  A negative result may occur with  improper specimen collection / handling, submission of specimen other  than nasopharyngeal swab, presence of viral mutation(s) within the  areas targeted by this assay, and inadequate number of viral copies  (<250 copies / mL). A negative result must be combined with clinical  observations, patient history, and epidemiological information. If result is POSITIVE SARS-CoV-2 target nucleic acids are DETECTED. The SARS-CoV-2 RNA is generally detectable in upper  and lower  respiratory specimens dur ing the acute phase of infection.  Positive  results are indicative of active infection with SARS-CoV-2.  Clinical  correlation with patient history and other diagnostic information is  necessary to determine patient infection status.  Positive results do  not rule out bacterial infection or co-infection with other viruses. If result is PRESUMPTIVE POSTIVE SARS-CoV-2 nucleic acids MAY BE PRESENT.   A presumptive positive result was obtained on the submitted specimen  and confirmed on repeat testing.  While 2019 novel coronavirus  (SARS-CoV-2) nucleic acids may be present in the submitted sample  additional confirmatory testing may be necessary for epidemiological  and / or clinical management purposes  to differentiate between  SARS-CoV-2 and other Sarbecovirus currently known to infect humans.  If clinically indicated additional testing with an alternate test  methodology [phone]([id number]) is advised. The SARS-CoV-2 RNA is generally  detectable in upper and lower respiratory sp ecimens during the acute  phase of infection. The expected result is Negative. Fact Sheet for Patients:  BoilerBrush.com.cyhttps://www.fda.gov/media/136312/download Fact Sheet for Healthcare Providers: https://pope.com/https://www.fda.gov/media/136313/download This test is not yet approved or cleared by the [place] FDA and has been authorized for detection and/or diagnosis of SARS-CoV-2 by FDA under an Emergency Use Authorization (EUA).  This EUA will remain in effect (meaning this test can be used) for the duration of the COVID-19 declaration under Section 564(b)(1) of the Act, 21 U.S.C. section 360bbb-3(b)(1), unless the authorization is terminated or revoked sooner. Performed at [facility], [address] [place], [place] [address]     Coagulation Studies: No results for input(s): LABPROT, INR in the last 72 hours.  Imaging: Mr Laqueta JeanBrain W And Wo Contrast  Result Date: 08/19/2018 CLINICAL  DATA:  Multiple sclerosis not on medication. New neurologic event EXAM: MRI HEAD WITHOUT AND WITH CONTRAST TECHNIQUE: Multiplanar, multiecho pulse sequences of the brain and surrounding structures were obtained without and with intravenous contrast. CONTRAST:  10 cc Gadavist intravenous COMPARISON:  None. FINDINGS: Brain: Numerous, in places confluent plaque-like areas in the bilateral cerebral white matter spanning periventricular to juxta cortical. Patchy FLAIR hyperintensity in the deep cerebellum, greatest about the right aspect of the fourth ventricle. There is associated cerebral white matter loss with corpus callosum thinning. No superimposed infarct, mass, hydrocephalus, or collection. 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 Chronic left pontine lacune which could be inflammatory or ischemic. No abnormal intracranial enhancement or restricted diffusion. Vascular: Major flow voids and vascular enhancements are preserved Skull and upper cervical spine: Negative for marrow lesion Sinuses/Orbits: Negative IMPRESSION: 1. Typical multiple sclerosis pattern with extensive plaque and cerebral volume loss. No restricted diffusion or enhancement. 2. 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 Electronically Signed   By: Marnee SpringJonathon  Watts M.D.   On: 08/19/2018 07:04   Mr Cervical Spine W Wo Contrast  Result Date: 08/19/2018  CLINICAL DATA:  Multiple sclerosis with new neurologic event EXAM: MRI TOTAL SPINE WITHOUT AND WITH CONTRAST TECHNIQUE: Multisequence MR imaging of the spine from the cervical spine to the sacrum was performed prior to and following IV contrast administration CONTRAST:  10 cc Gadavist intravenous COMPARISON:  None. FINDINGS: MRI CERVICAL SPINE FINDINGS Alignment: Normal Vertebrae: No fracture, evidence of discitis, or bone lesion. Cord: Short segment plaque in the left cord at C3-4 and in the central cord at C4-5. No cord thinning. No plaque enhancement or  expansion. Posterior Fossa, vertebral arteries, paraspinal tissues: Negative Disc levels: Minor degenerative changes without canal or foraminal impingement. C4-5 mild to moderate right foraminal narrowing from uncovertebral ridging. MRI THORACIC SPINE FINDINGS Alignment:  Exaggerated thoracic kyphosis.  Negative for listhesis. Vertebrae: No fracture, evidence of discitis, or bone lesion. Cord: Mild ventral cord flattening at T9-10 due to disc protrusion. No plaque is seen. Paraspinal and other soft tissues: Negative Disc levels: Spondylosis and exaggerated thoracic kyphosis. Small disc protrusions at T7-8, T8-9, and T9-10. Mild cord mass effect at T9-10. MRI LUMBAR SPINE FINDINGS Segmentation:  5 lumbar type vertebrae Alignment:  Negative for listhesis. Vertebrae:  No fracture, evidence of discitis, or bone lesion. Conus medullaris: Extends to the L1 level and appears normal. Paraspinal and other soft tissues: Negative Disc levels: L4-5 disc narrowing and bulging with posterior annular fissure. Tiny central protrusion. No neural compression L5-S1 mild disc narrowing and desiccation with small noncompressive central protrusion. IMPRESSION: 1. Left and central cord plaques at C3-4 and C4-5 without enhancement. 2. Negative thoracic cord and cauda equina. Electronically Signed   By: Monte Fantasia M.D.   On: 08/19/2018 07:13   Mr Thoracic Spine W Wo Contrast  Result Date: 08/19/2018 CLINICAL DATA:  Multiple sclerosis with new neurologic event EXAM: MRI TOTAL SPINE WITHOUT AND WITH CONTRAST TECHNIQUE: Multisequence MR imaging of the spine from the cervical spine to the sacrum was performed prior to and following IV contrast administration CONTRAST:  10 cc Gadavist intravenous COMPARISON:  None. FINDINGS: MRI CERVICAL SPINE FINDINGS Alignment: Normal Vertebrae: No fracture, evidence of discitis, or bone lesion. Cord: Short segment plaque in the left cord at C3-4 and in the central cord at C4-5. No cord thinning. No  plaque enhancement or expansion. Posterior Fossa, vertebral arteries, paraspinal tissues: Negative Disc levels: Minor degenerative changes without canal or foraminal impingement. C4-5 mild to moderate right foraminal narrowing from uncovertebral ridging. MRI THORACIC SPINE FINDINGS Alignment:  Exaggerated thoracic kyphosis.  Negative for listhesis. Vertebrae: No fracture, evidence of discitis, or bone lesion. Cord: Mild ventral cord flattening at T9-10 due to disc protrusion. No plaque is seen. Paraspinal and other soft tissues: Negative Disc levels: Spondylosis and exaggerated thoracic kyphosis. Small disc protrusions at T7-8, T8-9, and T9-10. Mild cord mass effect at T9-10. MRI LUMBAR SPINE FINDINGS Segmentation:  5 lumbar type vertebrae Alignment:  Negative for listhesis. Vertebrae:  No fracture, evidence of discitis, or bone lesion. Conus medullaris: Extends to the L1 level and appears normal. Paraspinal and other soft tissues: Negative Disc levels: L4-5 disc narrowing and bulging with posterior annular fissure. Tiny central protrusion. No neural compression L5-S1 mild disc narrowing and desiccation with small noncompressive central protrusion. IMPRESSION: 1. Left and central cord plaques at C3-4 and C4-5 without enhancement. 2. Negative thoracic cord and cauda equina. Electronically Signed   By: Monte Fantasia M.D.   On: 08/19/2018 07:13   Mr Lumbar Spine W Wo Contrast  Result Date: 08/19/2018 CLINICAL DATA:  Multiple sclerosis with  new neurologic event EXAM: MRI TOTAL SPINE WITHOUT AND WITH CONTRAST TECHNIQUE: Multisequence MR imaging of the spine from the cervical spine to the sacrum was performed prior to and following IV contrast administration CONTRAST:  10 cc Gadavist intravenous COMPARISON:  None. FINDINGS: MRI CERVICAL SPINE FINDINGS Alignment: Normal Vertebrae: No fracture, evidence of discitis, or bone lesion. Cord: Short segment plaque in the left cord at C3-4 and in the central cord at C4-5. No  cord thinning. No plaque enhancement or expansion. Posterior Fossa, vertebral arteries, paraspinal tissues: Negative Disc levels: Minor degenerative changes without canal or foraminal impingement. C4-5 mild to moderate right foraminal narrowing from uncovertebral ridging. MRI THORACIC SPINE FINDINGS Alignment:  Exaggerated thoracic kyphosis.  Negative for listhesis. Vertebrae: No fracture, evidence of discitis, or bone lesion. Cord: Mild ventral cord flattening at T9-10 due to disc protrusion. No plaque is seen. Paraspinal and other soft tissues: Negative Disc levels: Spondylosis and exaggerated thoracic kyphosis. Small disc protrusions at T7-8, T8-9, and T9-10. Mild cord mass effect at T9-10. MRI LUMBAR SPINE FINDINGS Segmentation:  5 lumbar type vertebrae Alignment:  Negative for listhesis. Vertebrae:  No fracture, evidence of discitis, or bone lesion. Conus medullaris: Extends to the L1 level and appears normal. Paraspinal and other soft tissues: Negative Disc levels: L4-5 disc narrowing and bulging with posterior annular fissure. Tiny central protrusion. No neural compression L5-S1 mild disc narrowing and desiccation with small noncompressive central protrusion. IMPRESSION: 1. Left and central cord plaques at C3-4 and C4-5 without enhancement. 2. Negative thoracic cord and cauda equina. Electronically Signed   By: Marnee Spring M.D.   On: 08/19/2018 07:13    Medications:  I have reviewed the patient's current medications. Scheduled: . baclofen  10 mg Oral BID  . docusate sodium  100 mg Oral BID  . enoxaparin (LOVENOX) injection  40 mg Subcutaneous Q24H  . nicotine  21 mg Transdermal Daily    Assessment/Plan: Patient requesting to go home.  Strength significantly improved after one dose of Solumedrol.  Patient clear that he has not completed treatment and consequences of such.    Recommendations: 1. No steroid taper required.     LOS: 1 day   Thana Farr,  MD Neurology [phone] 08/20/2018  9:59 AM

## 2018-08-20 NOTE — Progress Notes (Signed)
Physical Therapy Treatment Patient Details Name: Evan Reynolds. MRN: [id number] DOB: 06-24-1975 Today's Date: 08/20/2018    History of Present Illness Patient is a 43 year old male admitted with MS exacerbation. Reports LE weakness that led to leg buckling and fall. Imaging shows small remote infacts in cerebrum.    PT Comments    Pt ready for session.  Stated he is leaving AMA at 3:00 as it's the only time his ride is available to come to take him home - [place] away.  Bed mobility without assist.  Sitting independent and is able to LAQ BLE which he stated he could not do yesterday. Stood and transferred with RW to wheelchair with min guard.  2 laps Ind WC mobility while awaiting +2 assist per his preference.  Stood and was able to walk 1 lap around unit with walker and no knee buckling noted today.  Pt still feels more confident with +2 assist but it is not needed today.  No LOB or buckling.  Pt does not have a walker at home but will need one for safety.  Care manager aware of plans to leave AMA and will arrange for equipment as appropriate.   Follow Up Recommendations  Home health PT     Equipment Recommendations  Rolling walker with 5" wheels    Recommendations for Other Services       Precautions / Restrictions Precautions Precautions: Fall Restrictions Weight Bearing Restrictions: No    Mobility  Bed Mobility Overal bed mobility: Modified Independent                Transfers Overall transfer level: Needs assistance Equipment used: Rolling walker (2 wheeled) Transfers: Sit to/from Stand Sit to Stand: Min guard            Ambulation/Gait Ambulation/Gait assistance: Min guard Gait Distance (Feet): 180 Feet Assistive device: Rolling walker (2 wheeled) Gait Pattern/deviations: Step-through pattern Gait velocity: decreased   General Gait Details: Significanlty improved gait today with increased strength and mobility.   Stairs              Wheelchair Mobility    Modified Rankin (Stroke Patients Only)       Balance Overall balance assessment: Needs assistance Sitting-balance support: Feet supported Sitting balance-Leahy Scale: Good     Standing balance support: Bilateral upper extremity supported Standing balance-Leahy Scale: Fair Standing balance comment: relies on walker but overall does well even with one hand support.                            Cognition Arousal/Alertness: Awake/alert Behavior During Therapy: WFL for tasks assessed/performed Overall Cognitive Status: Within Functional Limits for tasks assessed                                        Exercises      General Comments        Pertinent Vitals/Pain Pain Assessment: No/denies pain Pain Descriptors / Indicators: Aching;Discomfort Pain Intervention(s): Limited activity within patient's tolerance;Monitored during session    Home Living                      Prior Function            PT Goals (current goals can now be found in the care plan section) Progress towards PT goals:  Progressing toward goals    Frequency    Min 2X/week      PT Plan Current plan remains appropriate    Co-evaluation              AM-PAC PT "6 Clicks" Mobility   Outcome Measure  Help needed turning from your back to your side while in a flat bed without using bedrails?: None Help needed moving from lying on your back to sitting on the side of a flat bed without using bedrails?: None Help needed moving to and from a bed to a chair (including a wheelchair)?: A Little Help needed standing up from a chair using your arms (e.g., wheelchair or bedside chair)?: A Little Help needed to walk in hospital room?: A Little Help needed climbing 3-5 steps with a railing? : A Lot 6 Click Score: 19    End of Session Equipment Utilized During Treatment: Gait belt Activity Tolerance: Patient tolerated treatment  well Patient left: in chair;with call bell/phone within reach;with chair alarm set Nurse Communication: Other (comment) Pain - part of body: Leg     Time: 1438-8875 PT Time Calculation (min) (ACUTE ONLY): 19 min  Charges:  $Gait Training: 8-22 mins                    Danielle Dess, PTA 08/20/18, 11:08 AM

## 2018-08-20 NOTE — Plan of Care (Signed)

## 2018-08-21 LAB — BASIC METABOLIC PANEL
Anion gap: 8 (ref 5–15)
BUN: 18 mg/dL (ref 6–20)
CO2: 25 mmol/L (ref 22–32)
Calcium: 8.5 mg/dL — ABNORMAL LOW (ref 8.9–10.3)
Chloride: 105 mmol/L (ref 98–111)
Creatinine, Ser: 0.95 mg/dL (ref 0.61–1.24)
GFR calc Af Amer: >60 mL/min (ref >60)
GFR calc non Af Amer: >60 mL/min (ref >60)
Glucose, Bld: 175 mg/dL — ABNORMAL HIGH (ref 70–99)
Potassium: 4.4 mmol/L (ref 3.5–5.1)
Sodium: 138 mmol/L (ref 135–145)

## 2018-08-21 LAB — CBC
HCT: 46.1 % (ref 39.0–52.0)
Hemoglobin: 15.3 g/dL (ref 13.0–17.0)
MCH: 31 pg (ref 26.0–34.0)
MCHC: 33.2 g/dL (ref 30.0–36.0)
MCV: 93.5 fL (ref 80.0–100.0)
Platelets: 327 10*3/uL (ref 150–400)
RBC: 4.93 MIL/uL (ref 4.22–5.81)
RDW: 13 % (ref 11.5–15.5)
WBC: 11.7 10*3/uL — ABNORMAL HIGH (ref 4.0–10.5)
nRBC: 0 % (ref 0.0–0.2)

## 2018-08-21 LAB — VITAMIN D 25 HYDROXY (VIT D DEFICIENCY, FRACTURES): Vit D, 25-Hydroxy: 20.5 ng/mL — ABNORMAL LOW (ref 30.0–100.0)

## 2018-08-21 MED ORDER — VITAMIN B-12 1000 MCG PO TABS
1000.0000 ug | ORAL_TABLET | Freq: Every day | ORAL | Status: DC
  Administered 2018-08-21: 1000 ug via ORAL
  Filled 2018-08-21: qty 1

## 2018-08-21 MED ORDER — POLYETHYLENE GLYCOL 3350 17 G PO PACK
17.0000 g | PACK | Freq: Every day | ORAL | Status: DC
  Administered 2018-08-21: 17 g via ORAL
  Filled 2018-08-21: qty 1

## 2018-08-21 MED ORDER — SENNOSIDES-DOCUSATE SODIUM 8.6-50 MG PO TABS
2.0000 | ORAL_TABLET | Freq: Two times a day (BID) | ORAL | Status: DC
  Administered 2018-08-21: 2 via ORAL
  Filled 2018-08-21: qty 2

## 2018-08-21 MED ORDER — VITAMIN D 25 MCG (1000 UNIT) PO TABS
1000.0000 [IU] | ORAL_TABLET | Freq: Every day | ORAL | Status: DC
  Administered 2018-08-21: 09:00:00 1000 [IU] via ORAL
  Filled 2018-08-21: qty 1

## 2018-08-21 NOTE — Discharge Instructions (Signed)
Multiple Sclerosis  Multiple sclerosis (MS) is a disease of the brain, spinal cord, and optic nerves (central nervous system). It causes the body's disease-fighting (immune) system to destroy the protective covering (myelin sheath) around nerves in the brain. When this happens, signals (nerve impulses) going to and from the brain and spinal cord do not get sent properly or may not get sent at all.  There are several types of MS:  · Relapsing-remitting MS. This is the most common type. This causes sudden attacks of symptoms. After an attack, you may recover completely until the next attack, or some symptoms may remain permanently.  · Secondary progressive MS. This usually develops after the onset of relapsing-remitting MS. Similar to relapsing-remitting MS, this type also causes sudden attacks of symptoms. Attacks may be less frequent, but symptoms slowly get worse (progress) over time.  · Primary progressive MS. This causes symptoms that steadily progress over time. This type of MS does not cause sudden attacks of symptoms.  The age of onset of MS varies, but it often develops between 20-40 years of age. MS is a lifelong (chronic) condition. There is no cure, but treatment can help slow down the progression of the disease.  What are the causes?  The cause of this condition is not known.  What increases the risk?  You are more likely to develop this condition if:  · You are a woman.  · You have a relative with MS. However, the condition is not passed from parent to child (inherited).  · You have a lack (deficiency) of vitamin D.  · You smoke.  MS is more common in the northern [place] than in the southern [place].  What are the signs or symptoms?  Relapsing-remitting and secondary progressive MS cause symptoms to occur in episodes or attacks that may last weeks to months. There may be long periods between attacks in which there are almost no symptoms. Primary progressive MS causes symptoms to steadily  progress after they develop.  Symptoms of MS vary because of the many different ways it affects the central nervous system. The main symptoms include:  · Vision problems and eye pain.  · Numbness.  · Weakness.  · Inability to move your arms, hands, feet, or legs (paralysis).  · Balance problems.  · Shaking that you cannot control (tremors).  · Muscle spasms.  · Problems with thinking (cognitive changes).  MS can also cause symptoms that are associated with the disease, but are not always the direct result of an MS attack. They may include:  · Inability to control urination or bowel movements (incontinence).  · Headaches.  · Fatigue.  · Inability to tolerate heat.  · Emotional changes.  · Depression.  · Pain.  How is this diagnosed?  This condition is diagnosed based on:  · Your symptoms.  · A neurological exam. This involves checking central nervous system function, such as nerve function, reflexes, and coordination.  · MRIs of the brain and spinal cord.  · Lab tests, including a lumbar puncture that tests the fluid that surrounds the brain and spinal cord (cerebrospinal fluid).  · Tests to measure the electrical activity of the brain in response to stimulation (evoked potentials).  How is this treated?  There is no cure for MS, but medicines can help decrease the number and frequency of attacks and help relieve nuisance symptoms. Treatment options may include:  · Medicines that reduce the frequency of attacks. These medicines may be given by   injection, by mouth (orally), or through an IV.  · Medicines that reduce inflammation (steroids). These may provide short-term relief of symptoms.  · Medicines to help control pain, depression, fatigue, or incontinence.  · Vitamin D, if you have a deficiency.  · Using devices to help you move around (assistive devices), such as braces, a cane, or a walker.  · Physical therapy to strengthen and stretch your muscles.  · Occupational therapy to help you with everyday  tasks.  · Alternative or complementary treatments such as exercise, massage, or acupuncture.  Follow these instructions at home:  · Take over-the-counter and prescription medicines only as told by your health care provider.  · Do not drive or use heavy machinery while taking prescription pain medicine.  · Use assistive devices as recommended by your physical therapist or your health care provider.  · Exercise as directed by your health care provider.  · Return to your normal activities as told by your health care provider. Ask your health care provider what activities are safe for you.  · Reach out for support. Share your feelings with friends, family, or a support group.  · Keep all follow-up visits as told by your health care provider and therapists. This is important.  Where to find more information  · National Multiple Sclerosis Society: https://www.nationalmssociety.org  Contact a health care provider if:  · You feel depressed.  · You develop new pain or numbness.  · You have tremors.  · You have problems with sexual function.  Get help right away if:  · You develop paralysis.  · You develop numbness.  · You have problems with your bladder or bowel function.  · You develop double vision.  · You lose vision in one or both eyes.  · You develop suicidal thoughts.  · You develop severe confusion.  If you ever feel like you may hurt yourself or others, or have thoughts about taking your own life, get help right away. You can go to your nearest emergency department or call:  · Your local emergency services (911 in the [place]  · A suicide crisis helpline, such as the National Suicide Prevention Lifeline at 1-800-273-8255. This is open 24 hours a day.  Summary  · Multiple sclerosis (MS) is a disease of the central nervous system that causes the body's immune system to destroy the protective covering (myelin sheath) around nerves in the brain.  · There are 3 types of MS: relapsing-remitting, secondary progressive, and  primary progressive. Relapsing-remitting and secondary progressive MS cause symptoms to occur in episodes or attacks that may last weeks to months. Primary progressive MS causes symptoms to steadily progress after they develop.  · There is no cure for MS, but medicines can help decrease the number and frequency of attacks and help relieve nuisance symptoms. Treatment may also include physical or occupational therapy.  · If you develop numbness, paralysis, vision problems, or other neurological symptoms, get help right away.  This information is not intended to replace advice given to you by your health care provider. Make sure you discuss any questions you have with your health care provider.  Document Released: 01/19/2000 Document Revised: 01/03/2017 Document Reviewed: 04/01/2016  Elsevier Patient Education © 2020 Elsevier Inc.

## 2018-08-21 NOTE — TOC Transition Note (Signed)
Transition of Care [facility]) - CM/SW Discharge Note   Patient Details  Name: Sabastien Tyler. MRN: [id number] Date of Birth: 1975-12-21  Transition of [facility] CM/SW Contact:  Magin Balbi, Lenice Llamas Phone Number: [phone]  08/21/2018, 10:46 AM   Clinical Narrative: Clinical Social Worker (CSW) met with patient to discuss D/C plan. Patient was alert and oriented X4 and was sitting up in the bed. CSW introduced self and explained role of CSW department. Per patient he spoke with his mother Evan Reynolds via telephone today and she has agreed to pay for a bus ticket for him to get to her house in [place]. Per patient [place] home [place] 20 minutes from the [place]. Patient reported that his mother is going to take care of him. Patient reported that he is calling people now to get a ride to the bus station in [place]. Patient reported that he has been homeless for 5 years and has no home to go to. Patient reported that he is from [place] and came back to say goodbye to his friends because he is moving to his mother's home. CSW explained that patient will need to get established with a PCP in [place]. CSW also explained that PT is recommending home health however since patient is moving out of state home health can't be set up. CSW explained that once patient is established with a PCP they can order home health for him. Patient verbalized his understanding. Patient reported that he was using the [employer] in [place], [place] but he had to wait 2 years to get housing. CSW explained that he will have to apply for housing in the county he [place] living in [place] through their local housing authority. Patient verbalized hs understanding. RN aware of above. Please reconsult if future social work needs arise. CSW signing off.        Final next level of care: Home/Self Care Barriers to Discharge: Barriers Resolved   Patient Goals and CMS Choice Patient states their goals for this  hospitalization and ongoing recovery are:: go home with friend in [place]       Discharge Placement                       Discharge Plan and Services   Discharge Planning Services: CM Consult            DME Arranged: Gilford Rile rolling DME Agency: [employer] Date DME Agency Contacted: 08/19/18 Time DME Agency Contacted: 0938 Representative spoke with at DME Agency: [place]: NA          Social Determinants of Health ([place]) Interventions     Readmission Risk Interventions No flowsheet data found.

## 2018-08-21 NOTE — Progress Notes (Signed)
Pt is ready to be discharge. Per pt, he will take a taxi to the bus station, a bus will take him to [place], in [place] he will be staying at his mother's house.

## 2018-08-21 NOTE — Progress Notes (Signed)
Subjective: Patient moving all extremities at full strength on yesterday and was ready to sign out AMA.  His friends did not come to pick him up.  He now reports that his left leg has a lot of pain and is weak again.  Reports he has nowhere to go.    Objective: Current vital signs: BP 124/88 (BP Location: Left Arm)   Pulse 66   Temp 97.7 F (36.5 C) (Oral)   Resp 18   Ht 6\' 1"  (1.854 m)   Wt 108.8 kg   SpO2 99%   BMI 31.65 kg/m  Vital signs in last 24 hours: Temp:  [97.6 F (36.4 C)-97.7 F (36.5 C)] 97.7 F (36.5 C) (07/17 0906) Pulse Rate:  [66-72] 66 (07/17 0906) Resp:  [18] 18 (07/17 0906) BP: (124)/(80-88) 124/88 (07/17 0906) SpO2:  [98 %-99 %] 99 % (07/17 0906) Weight:  [108.8 kg] 108.8 kg (07/17 0354)  Intake/Output from previous day: 07/16 0701 - 07/17 0700 In: 1010 [P.O.:960; IV Piggyback:50] Out: 2320 [Urine:2320] Intake/Output this shift: Total I/O In: 240 [P.O.:240] Out: -  Nutritional status:  Diet Order            Diet - low sodium heart healthy        Diet Heart Room service appropriate? Yes; Fluid consistency: Thin  Diet effective now              Neurologic Exam: Mental Status: Alert, oriented, thought content appropriate.  Speech fluent without evidence of aphasia.  Able to follow 3 step commands without difficulty. Cranial Nerves: II: Discs flat bilaterally; Visual fields grossly normal, pupils equal, round, reactive to light and accommodation III,IV, VI: ptosis not present, extra-ocular motions intact bilaterally V,VII: smile symmetric, facial light touch sensation normal bilaterally VIII: hearing normal bilaterally IX,X: gag reflex present XI: bilateral shoulder shrug XII: midline tongue extension Motor: Right : Upper extremity   5/5    Left:     Upper extremity   5/5  Lower extremity   5/5     Lower extremity   4-/5 Tone and bulk:normal tone throughout; no atrophy noted Sensory: Pinprick and light touch decreased below the knees  bilaterally   Lab Results: Basic Metabolic Panel: Recent Labs  Lab 08/19/18 0058 08/21/18 0439  NA 142 138  K 4.1 4.4  CL 107 105  CO2 29 25  GLUCOSE 92 175*  BUN 15 18  CREATININE 1.08 0.95  CALCIUM 8.7* 8.5*    Liver Function Tests: Recent Labs  Lab 08/19/18 0058  AST 12*  ALT 13  ALKPHOS 54  BILITOT 0.6  PROT 6.9  ALBUMIN 4.0   No results for input(s): LIPASE, AMYLASE in the last 168 hours. No results for input(s): AMMONIA in the last 168 hours.  CBC: Recent Labs  Lab 08/19/18 0058 08/21/18 0439  WBC 9.5 11.7*  NEUTROABS 4.5  --   HGB 15.6 15.3  HCT 47.1 46.1  MCV 94.0 93.5  PLT 326 327    Cardiac Enzymes: No results for input(s): CKTOTAL, CKMB, CKMBINDEX, TROPONINI in the last 168 hours.  Lipid Panel: No results for input(s): CHOL, TRIG, HDL, CHOLHDL, VLDL, LDLCALC in the last 168 hours.  CBG: No results for input(s): GLUCAP in the last 168 hours.  Microbiology: Results for orders placed or performed during the hospital encounter of 08/19/18  SARS Coronavirus 2 (CEPHEID - [facility]formed [facility] lab), Hosp Order     Status: None   Collection Time: 08/19/18 12:58 AM  Specimen: Nasopharyngeal Swab  Result Value Ref Range Status   SARS Coronavirus 2 NEGATIVE NEGATIVE Final    Comment: (NOTE) If result is NEGATIVE SARS-CoV-2 target nucleic acids are NOT DETECTED. The SARS-CoV-2 RNA is generally detectable in upper and lower  respiratory specimens during the acute phase of infection. The lowest  concentration of SARS-CoV-2 viral copies this assay can detect is 250  copies / mL. A negative result does not preclude SARS-CoV-2 infection  and should not be used as the sole basis for treatment or other  patient management decisions.  A negative result may occur with  improper specimen collection / handling, submission of specimen other  than nasopharyngeal swab, presence of viral mutation(s) within the  areas targeted by this assay, and  inadequate number of viral copies  (<250 copies / mL). A negative result must be combined with clinical  observations, patient history, and epidemiological information. If result is POSITIVE SARS-CoV-2 target nucleic acids are DETECTED. The SARS-CoV-2 RNA is generally detectable in upper and lower  respiratory specimens dur ing the acute phase of infection.  Positive  results are indicative of active infection with SARS-CoV-2.  Clinical  correlation with patient history and other diagnostic information is  necessary to determine patient infection status.  Positive results do  not rule out bacterial infection or co-infection with other viruses. If result is PRESUMPTIVE POSTIVE SARS-CoV-2 nucleic acids MAY BE PRESENT.   A presumptive positive result was obtained on the submitted specimen  and confirmed on repeat testing.  While 2019 novel coronavirus  (SARS-CoV-2) nucleic acids may be present in the submitted sample  additional confirmatory testing may be necessary for epidemiological  and / or clinical management purposes  to differentiate between  SARS-CoV-2 and other Sarbecovirus currently known to infect humans.  If clinically indicated additional testing with an alternate test  methodology [phone]) is advised. The SARS-CoV-2 RNA is generally  detectable in upper and lower respiratory sp ecimens during the acute  phase of infection. The expected result is Negative. Fact Sheet for Patients:  StrictlyIdeas.no Fact Sheet for Healthcare Providers: BankingDealers.co.za This test is not yet approved or cleared by the [place] FDA and has been authorized for detection and/or diagnosis of SARS-CoV-2 by FDA under an Emergency Use Authorization (EUA).  This EUA will remain in effect (meaning this test can be used) for the duration of the COVID-19 declaration under Section 564(b)(1) of the Act, 21 U.S.C. section 360bbb-3(b)(1), unless the  authorization is terminated or revoked sooner. Performed at [facility], [address] [place], [place] [address]     Coagulation Studies: No results for input(s): LABPROT, INR in the last 72 hours.  Imaging: No results found.  Medications:  I have reviewed the patient's current medications. Scheduled: . baclofen  10 mg Oral BID  . cholecalciferol  1,000 Units Oral Daily  . enoxaparin (LOVENOX) injection  40 mg Subcutaneous Q24H  . nicotine  21 mg Transdermal Daily  . polyethylene glycol  17 g Oral Daily  . senna-docusate  2 tablet Oral BID  . vitamin B-12  1,000 mcg Oral Daily    Assessment/Plan: Patient improved from presentation. Ambulated with walker on yesterday.  Baclofen has helped with spasms.  Today is day 3 of 3 IV Solumedrol.  Recommendations: 1. Continue therapy 2. No po steroid taper required 3. Continue Baclofen   LOS: 2 days   Alexis Goodell, MD Neurology [phone] 08/21/2018  11:17 AM

## 2018-08-21 NOTE — Care Management Important Message (Signed)
Important Message  Patient Details  Name: Evan Reynolds. MRN: [id number] Date of Birth: November 12, 1975   Medicare Important Message Given:  Yes     Juliann Pulse A Erasto Sleight 08/21/2018, 10:48 AM

## 2018-08-22 NOTE — Discharge Summary (Signed)
[facility] [place] [facility]   PATIENT NAME: Evan Reynolds    MR#:  [id number]  DATE OF BIRTH:  03-Aug-1975  DATE OF ADMISSION:  08/19/2018   ADMITTING PHYSICIAN: Arnaldo Natal, MD  DATE OF DISCHARGE: 08/21/2018  1:33 PM  PRIMARY CARE PHYSICIAN: Patient, No Pcp Per   ADMISSION DIAGNOSIS:  Multiple sclerosis (HCC) [G35] DISCHARGE DIAGNOSIS:  Active Problems:   Multiple sclerosis exacerbation (HCC)  SECONDARY DIAGNOSIS:   Past Medical History:  Diagnosis Date  . MS (multiple sclerosis) [facility])    HOSPITAL COURSE:  43 year old male admitted for MS exacerbation consisting of BLE and LUE weakness and numbness.   *Multiple sclerosis exacerbation - MRI showing multiple brain and cervical cord plaques with no evidence of enhancement.  -Patient improved from presentation. Ambulated with walker.  Baclofen has helped with spasms.  Today is day 3 of 3 IV Solumedrol and completed the treatment. - No po steroid taper required per Neuro - Continue Baclofen (patient takes it chronically and has enough with him at home)  * Depression: Stable; continue Wellbutrin  *Overweight: BMI is 30.7; encouraged healthy diet and exercise DISCHARGE CONDITIONS:  stable CONSULTS OBTAINED:  Treatment Team:  Kym Groom, MD DRUG ALLERGIES:   Allergies  Allergen Reactions  . Penicillins Other (See Comments)    Was told that if he takes, it will kill him Did it involve swelling of the face/tongue/throat, SOB, or low BP? Unknown Did it involve sudden or severe rash/hives, skin peeling, or any reaction on the inside of your mouth or nose? Unknown Did you need to seek medical attention at a hospital or doctor's office? Unknown When did it last happen?childhood If all above answers are "NO", may proceed with cephalosporin use.    DISCHARGE MEDICATIONS:   Allergies as of 08/21/2018      Reactions   Penicillins Other (See Comments)   Was told that if he takes, it  will kill him Did it involve swelling of the face/tongue/throat, SOB, or low BP? Unknown Did it involve sudden or severe rash/hives, skin peeling, or any reaction on the inside of your mouth or nose? Unknown Did you need to seek medical attention at a hospital or doctor's office? Unknown When did it last happen?childhood If all above answers are "NO", may proceed with cephalosporin use.      Medication List    STOP taking these medications   buPROPion 150 MG 24 hr tablet Commonly known as: WELLBUTRIN XL   cholecalciferol 25 MCG (1000 UT) tablet Commonly known as: VITAMIN D3   cyanocobalamin 1000 MCG tablet   methocarbamol 750 MG tablet Commonly known as: ROBAXIN        DISCHARGE INSTRUCTIONS:   DIET:  Regular diet DISCHARGE CONDITION:  Good ACTIVITY:  Activity as tolerated OXYGEN:  Home Oxygen: No.  Oxygen Delivery: room air DISCHARGE LOCATION:  home   If you experience worsening of your admission symptoms, develop shortness of breath, life threatening emergency, suicidal or homicidal thoughts you must seek medical attention immediately by calling 911 or calling your MD immediately  if symptoms less severe.  You Must read complete instructions/literature along with all the possible adverse reactions/side effects for all the Medicines you take and that have been prescribed to you. Take any new Medicines after you have completely understood and accpet all the possible adverse reactions/side effects.   Please note  You were cared for by a hospitalist during your hospital stay. If you have any  questions about your discharge medications or the care you received while you were in the hospital after you are discharged, you can call the unit and asked to speak with the hospitalist on call if the hospitalist that took care of you is not available. Once you are discharged, your primary care physician will handle any further medical issues. Please note that NO REFILLS for any  discharge medications will be authorized once you are discharged, as it is imperative that you return to your primary care physician (or establish a relationship with a primary care physician if you do not have one) for your aftercare needs so that they can reassess your need for medications and monitor your lab values.    On the day of Discharge:  VITAL SIGNS:  Blood pressure 135/83, pulse 87, temperature (!) 97.5 F (36.4 C), temperature source Oral, resp. rate 18, height 6\' 1"  (1.854 m), weight 108.8 kg, SpO2 99 %. PHYSICAL EXAMINATION:  GENERAL:  43 y.o.-year-old patient lying in the bed with no acute distress.  EYES: Pupils equal, round, reactive to light and accommodation. No scleral icterus. Extraocular muscles intact.  HEENT: Head atraumatic, normocephalic. Oropharynx and nasopharynx clear.  NECK:  Supple, no jugular venous distention. No thyroid enlargement, no tenderness.  LUNGS: Normal breath sounds bilaterally, no wheezing, rales,rhonchi or crepitation. No use of accessory muscles of respiration.  CARDIOVASCULAR: S1, S2 normal. No murmurs, rubs, or gallops.  ABDOMEN: Soft, non-tender, non-distended. Bowel sounds present. No organomegaly or mass.  EXTREMITIES: No pedal edema, cyanosis, or clubbing.  NEUROLOGIC: Cranial nerves II through XII are intact. Muscle strength 5/5 in all extremities. Sensation intact. Gait not checked.  PSYCHIATRIC: The patient is alert and oriented x 3.  SKIN: No obvious rash, lesion, or ulcer.  DATA REVIEW:   CBC Recent Labs  Lab 08/21/18 0439  WBC 11.7*  HGB 15.3  HCT 46.1  PLT 327    Chemistries  Recent Labs  Lab 08/19/18 0058 08/21/18 0439  NA 142 138  K 4.1 4.4  CL 107 105  CO2 29 25  GLUCOSE 92 175*  BUN 15 18  CREATININE 1.08 0.95  CALCIUM 8.7* 8.5*  AST 12*  --   ALT 13  --   ALKPHOS 54  --   BILITOT 0.6  --      Management plans discussed with the patient, family and they are in agreement.  CODE STATUS: Prior    TOTAL TIME TAKING CARE OF THIS PATIENT: 45 minutes.    Max Sane M.D on 08/22/2018 at 11:25 AM  Between 7am to 6pm - Pager - [phone]  After 6pm go to [email or web address] - Proofreader  [facility] [place] [facility]  Office  [phone]  CC: Primary care physician; Patient, No Pcp Per   Note: This dictation was prepared with Dragon dictation along with smaller phrase technology. Any transcriptional errors that result from this process are unintentional.

## 2020-04-17 IMAGING — MR MRI HEAD WITHOUT AND WITH CONTRAST
31 of 45 series · 34 of 48 positions shown · IV contrast (gadavist)
Comparison: None.

CLINICAL DATA: Multiple sclerosis not on medication. New neurologic
event

EXAM:
MRI HEAD WITHOUT AND WITH CONTRAST
TECHNIQUE: Multiplanar, multiecho pulse sequences of the brain and surrounding
structures were obtained without and with intravenous contrast.
CONTRAST:  10 cc Gadavist intravenous

[Series 9: T1 · sagittal · 5.0mm · 0.62mm/px · 1 of 25 slices shown (1 of 7)]
[im 1/25]
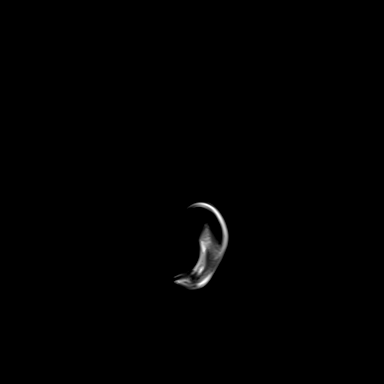

[Series 10: FLAIR · sagittal · 5.0mm · 0.94mm/px · 1 of 25 slices shown (1 of 3)]
[im 1/25]
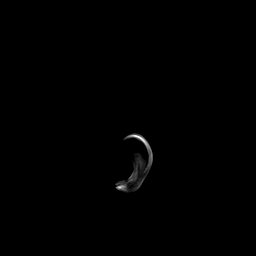

[Series 11: T2 · axial · 5.0mm · 0.53mm/px · 1 of 27 slices shown (1 of 8)]
[im 1/27]
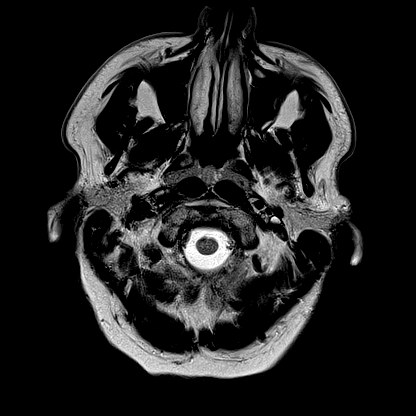

[Series 16: FLAIR · axial · 3.0mm · 0.53mm/px · 1 of 55 slices shown (2 of 3)]
[im 1/55]
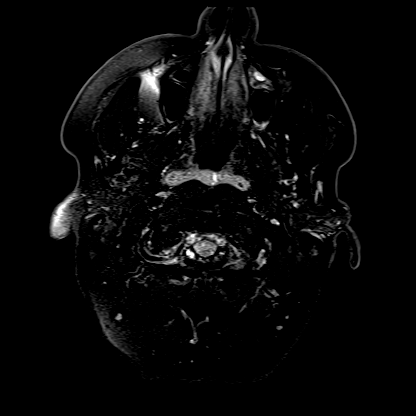

[Series 17: T1 · axial · 1.0mm · 0.98mm/px · z∈[-74,+98]mm · 2 of 176 slices shown (2 of 7)]
[im 1/176]
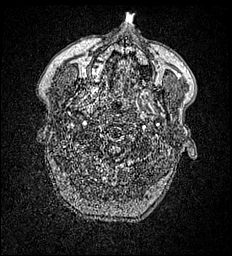
[im 176/176]
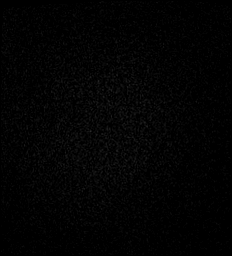

[Series 32: T1 · sagittal · 6.0mm · 1.88mm/px · 1 of 9 slices shown (3 of 7)]
[im 1/9]
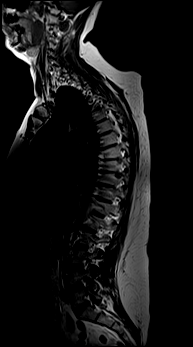

[Series 33: T2 · sagittal · 3.0mm · 1.06mm/px · 1 of 17 slices shown (2 of 8)]
[im 1/17]
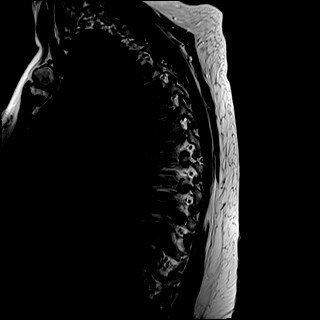

[Series 34: T2 · sagittal · 3.0mm · 1.06mm/px · 1 of 20 slices shown (3 of 8)]
[im 1/20]
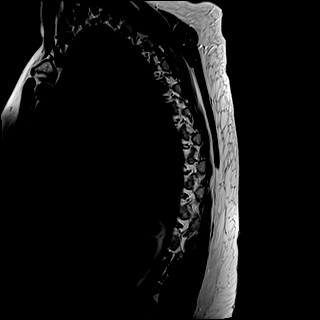

[Series 35: T1 · sagittal · 3.0mm · 1.06mm/px · 1 of 20 slices shown (4 of 7)]
[im 1/20]
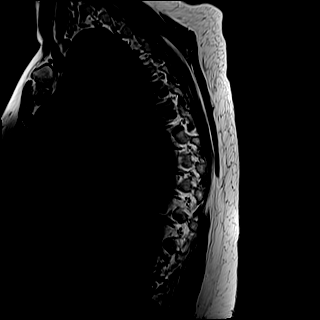

[Series 37: T2 · axial · 4.0mm · 0.59mm/px · 1 of 39 slices shown (4 of 8)]
[im 1/39]
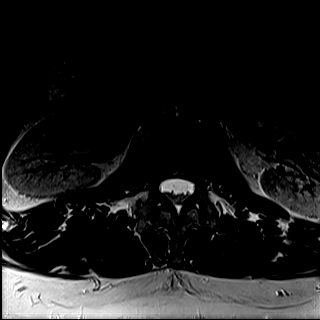

[Series 38: GRE · axial · 4.0mm · 0.37mm/px · 1 of 39 slices shown]
[im 1/39]
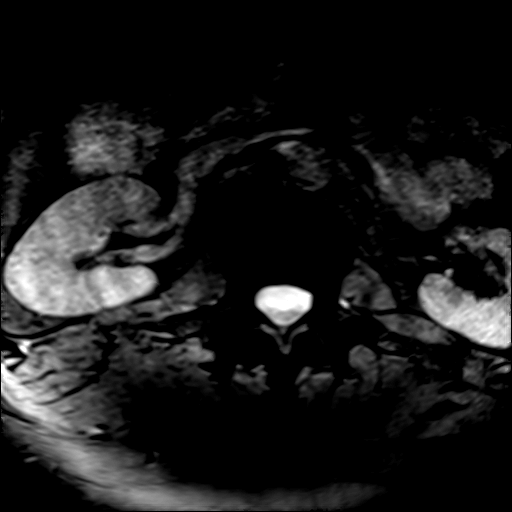

[Series 39: T1 post-contrast · axial · non-contrast · 4.0mm · 0.37mm/px · 1 of 39 slices shown (1 of 9)]
[im 1/39]
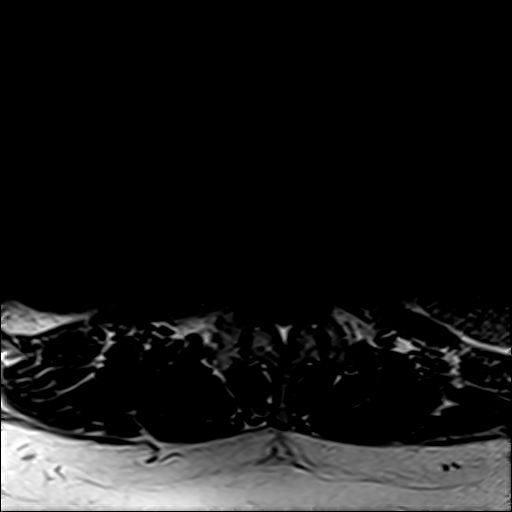

[Series 40: T2 · sagittal · 3.0mm · 0.62mm/px · 1 of 15 slices shown (5 of 8)]
[im 1/15]
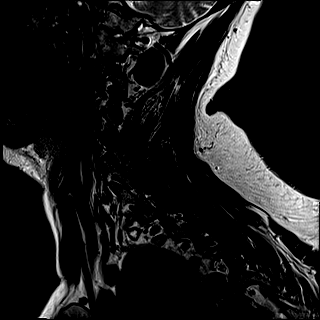

[Series 41: T1 post-contrast · axial · non-contrast · 4.0mm · 0.37mm/px · 1 of 39 slices shown (2 of 9)]
[im 1/39]
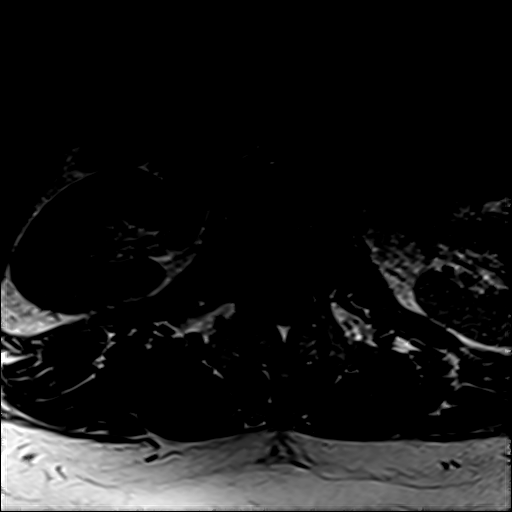

[Series 42: FLAIR · sagittal · 3.0mm · 0.78mm/px · 1 of 15 slices shown (3 of 3)]
[im 1/15]
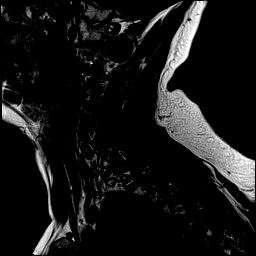

[Series 44: T2 · axial · 3.0mm · 0.70mm/px · 1 of 29 slices shown (6 of 8)]
[im 1/29]
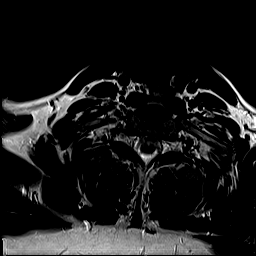

[Series 46: T1 · axial · non-contrast · 3.0mm · 0.35mm/px · 1 of 29 slices shown (5 of 7)]
[im 1/29]
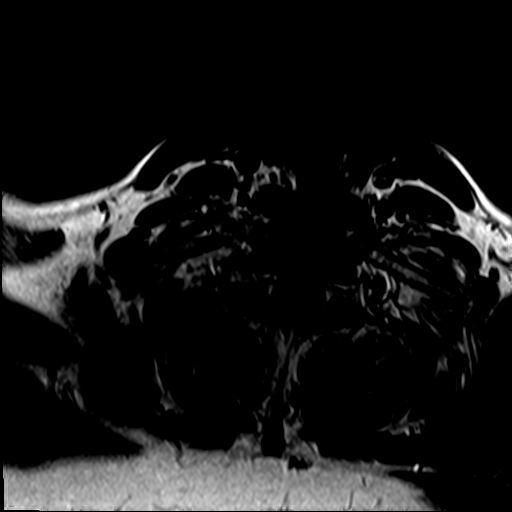

[Series 47: T2 · sagittal · 4.0mm · 0.81mm/px · 1 of 17 slices shown (7 of 8)]
[im 1/17]
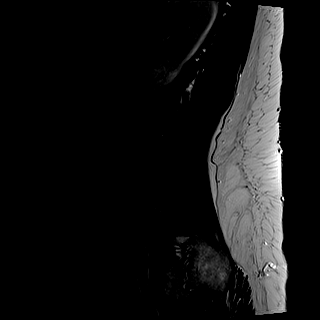

[Series 48: T1 · sagittal · 4.0mm · 0.81mm/px · 1 of 17 slices shown (6 of 7)]
[im 1/17]
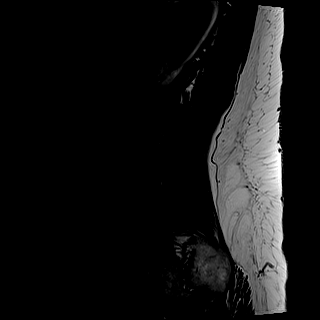

[Series 50: T2 · axial · 4.0mm · 0.78mm/px · 1 of 36 slices shown (8 of 8)]
[im 1/36]
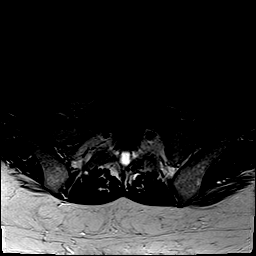

[Series 51: T1 · axial · 4.0mm · 0.39mm/px · 1 of 36 slices shown (7 of 7)]
[im 1/36]
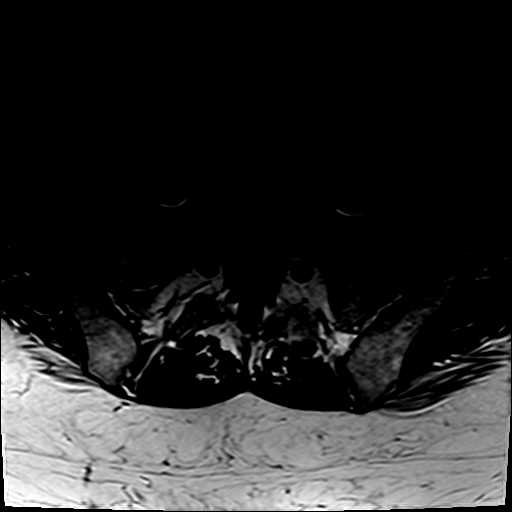

[Series 52: T1 post-contrast · axial · 4.0mm · 0.31mm/px · 1 of 39 slices shown (3 of 9)]
[im 1/39]
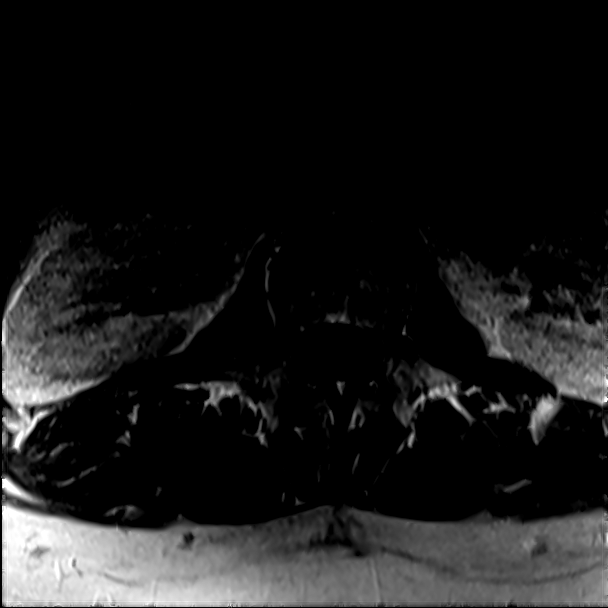

[Series 53: T1 fat-sat post-contrast · sagittal · 3.0mm · 1.06mm/px · 1 of 20 slices shown]
[im 1/20]
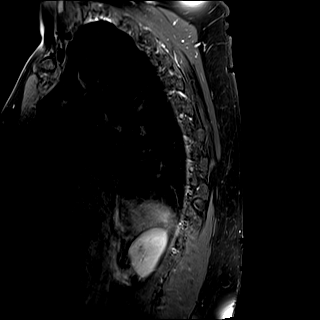

[Series 54: T1 post-contrast · axial · 4.0mm · 0.37mm/px · 1 of 39 slices shown (4 of 9)]
[im 1/39]
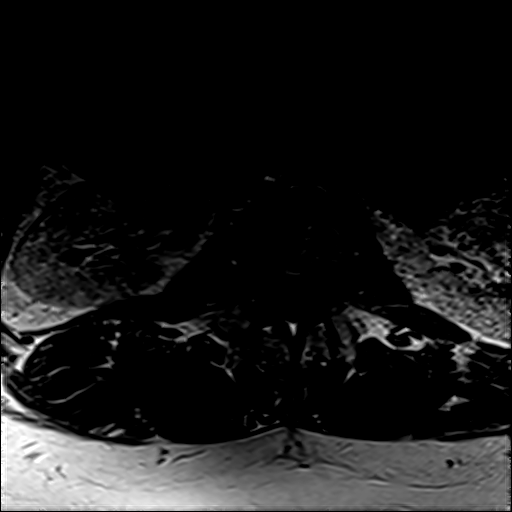

[Series 55: FLAIR fat-sat post-contrast · sagittal · 3.0mm · 0.78mm/px · 1 of 15 slices shown]
[im 1/15]
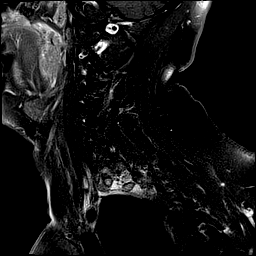

[Series 56: T1 post-contrast · axial · 3.0mm · 0.35mm/px · 1 of 28 slices shown (5 of 9)]
[im 1/28]
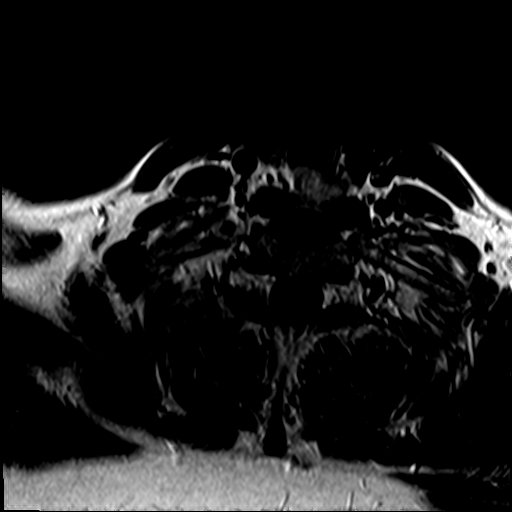

[Series 58: T1 post-contrast · axial · 4.0mm · 0.39mm/px · 1 of 36 slices shown (6 of 9)]
[im 1/36]
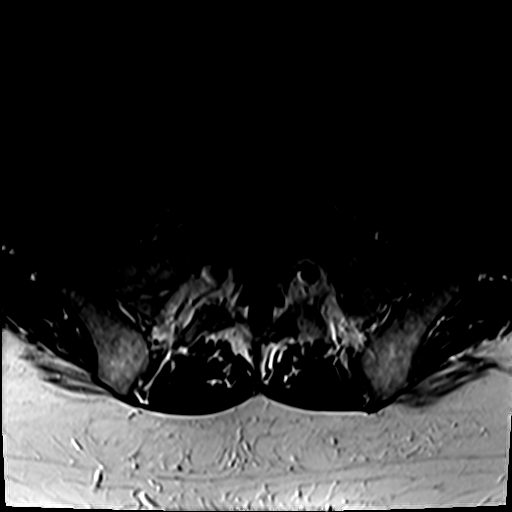

[Series 59: T2 post-contrast · coronal · 5.0mm · 0.57mm/px · 1 of 29 slices shown]
[im 1/29]
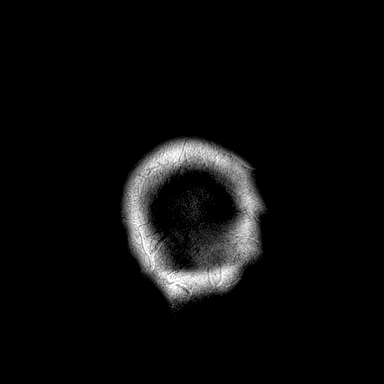

[Series 60: T1 post-contrast · axial · 1.0mm · 0.98mm/px · z∈[-65,+110]mm · 3 of 176 slices shown (7 of 9)]
[im 1/176]
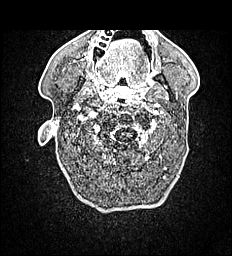
[im 88/176]
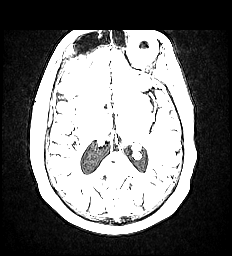
[im 176/176]
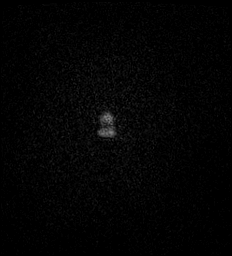

[Series 61: T1 post-contrast · coronal · 5.0mm · 0.57mm/px · 1 of 29 slices shown (8 of 9)]
[im 1/29]
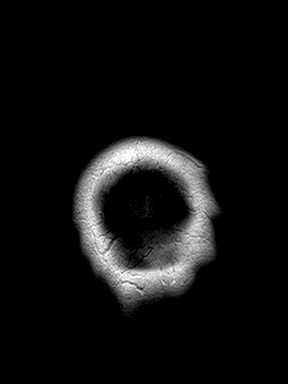

[Series 62: T1 post-contrast · sagittal · 5.0mm · 0.62mm/px · 1 of 25 slices shown (9 of 9)]
[im 1/25]
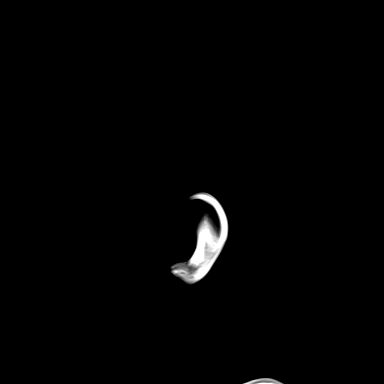

[34 of 48 positions shown; findings below may reference images not displayed]

FINDINGS: Brain: Numerous, in places confluent plaque-like areas in the
bilateral cerebral white matter spanning periventricular to juxta
cortical. Patchy FLAIR hyperintensity in the deep cerebellum,
greatest about the right aspect of the fourth ventricle. There is
associated cerebral white matter loss with corpus callosum thinning.
No superimposed infarct, mass, hydrocephalus, or collection.

There is a cluster of small remote left cerebellar infarcts. Chronic
left pontine lacune which could be inflammatory or ischemic. No
abnormal intracranial enhancement or restricted diffusion.

Vascular: Major flow voids and vascular enhancements are preserved

Skull and upper cervical spine: Negative for marrow lesion

Sinuses/Orbits: Negative
IMPRESSION: 1. Typical multiple sclerosis pattern with extensive plaque and
cerebral volume loss. No restricted diffusion or enhancement.
2. Cluster of small remote infarcts in the left cerebellum.
